# Patient Record
Sex: Male | Born: 1965 | Race: White | Hispanic: No | Marital: Married | Smoking: Former smoker
Health system: Southern US, Community
[De-identification: ages and names within clinical notes are randomized; demographics above are authoritative.]

## PROBLEM LIST (undated history)

## (undated) DIAGNOSIS — J302 Other seasonal allergic rhinitis: Secondary | ICD-10-CM

## (undated) DIAGNOSIS — E139 Other specified diabetes mellitus without complications: Secondary | ICD-10-CM

## (undated) DIAGNOSIS — K760 Fatty (change of) liver, not elsewhere classified: Secondary | ICD-10-CM

## (undated) DIAGNOSIS — I10 Essential (primary) hypertension: Secondary | ICD-10-CM

## (undated) DIAGNOSIS — E119 Type 2 diabetes mellitus without complications: Secondary | ICD-10-CM

## (undated) DIAGNOSIS — J309 Allergic rhinitis, unspecified: Secondary | ICD-10-CM

## (undated) DIAGNOSIS — M722 Plantar fascial fibromatosis: Secondary | ICD-10-CM

## (undated) DIAGNOSIS — G4730 Sleep apnea, unspecified: Secondary | ICD-10-CM

## (undated) DIAGNOSIS — K219 Gastro-esophageal reflux disease without esophagitis: Secondary | ICD-10-CM

## (undated) DIAGNOSIS — K769 Liver disease, unspecified: Secondary | ICD-10-CM

## (undated) HISTORY — DX: Type 2 diabetes mellitus without complications: E11.9

## (undated) HISTORY — PX: ELBOW SURGERY: SHX618

## (undated) HISTORY — PX: BACK SURGERY: SHX140

## (undated) HISTORY — DX: Essential (primary) hypertension: I10

## (undated) HISTORY — PX: SPINE SURGERY: SHX786

---

## 2014-11-06 ENCOUNTER — Ambulatory Visit
Admission: EM | Admit: 2014-11-06 | Discharge: 2014-11-06 | Disposition: A | Discharge status: Home / Self Care | Payer: BLUE CROSS/BLUE SHIELD | Attending: Internal Medicine | Admitting: Internal Medicine

## 2014-11-06 DIAGNOSIS — Z021 Encounter for pre-employment examination: Secondary | ICD-10-CM

## 2014-11-06 LAB — DEPT OF TRANSP DIPSTICK, URINE (ARMC ONLY)
Glucose, UA: NEGATIVE mg/dL
PROTEIN: 100 mg/dL — AB
Specific Gravity, Urine: 1.02 (ref 1.005–1.030)

## 2014-11-06 NOTE — ED Provider Notes (Signed)
CSN: [id number]     Arrival date & time 11/06/14  1901 History   None    Chief Complaint  Patient presents with  . Employment Physical   (Consider location/radiation/quality/duration/timing/severity/associated sxs/prior Treatment) HPI is a 49 year old male who presents for a DOT physical he states that time his run out on his medical card and [facility] [place] [facility] that his license was suspended. The patient states that that he is using a CPAP machine but has not been evaluated by pulmonologist or the machine adjusted in over 7 years. He denies most of his supplies from the Internet. His primary care had initially diagnosed him with the truck to sleep apnea and referred him to a pulmonologist but has never followed up. He states that he uses CPAP on a nightly basis no documentation is accompanying him today.  History reviewed. No pertinent past medical history. History reviewed. No pertinent past surgical history. History reviewed. No pertinent family history. History  Substance Use Topics  . Smoking status: Never Smoker   . Smokeless tobacco: Not on file  . Alcohol Use: Yes     Comment: social    Review of Systems  All other systems reviewed and are negative.   Allergies  Review of patient's allergies indicates no known allergies.  Home Medications   Prior to Admission medications   Medication Sig Start Date End Date Taking? Authorizing Provider  omeprazole (PRILOSEC) 10 MG capsule Take 10 mg by mouth daily.   Yes Historical Provider, MD   BP 124/89 mmHg  Pulse 74  Temp(Src) 97.6 F (36.4 C) (Tympanic)  Resp 16  Ht 5\' 9"  (1.753 m)  Wt 211 lb (95.709 kg)  BMI 31.15 kg/m2  SpO2 100% Physical Exam  Constitutional:  See DOT physical exam form    ED Course  Procedures (including critical care time) Labs Review Labs Reviewed  DEPT OF TRANSP DIPSTICK, URINE([facility] ONLY) - Abnormal; Notable for the following:    Protein, ur 100 (*)    Hgb urine dipstick TRACE  (*)    All other components within normal limits    Imaging Review No results found.   MDM   1. Physical exam, pre-employment    This patient is here for a DOT physical. Is a history of struck to sleep apnea and uses a CPAP machine on a nightly basis. He states that he has not had any scratches to the machine nor any follow-up with a pulmonologist for over 7 years. He has no documentation regarding use of his CPAP machine. I will give him 3 months to and to download his machine usage and also be evaluated by pulmonologist and cleared or driving. He also has some proteinuria that I asked him to follow-up with his primary care. When he returns with the approval from his pulmonologist in the usage of the machine if it's adequate I will then issue him a one-year certificate from today's date. He has the future that he must have this done on a yearly basis along with a documentation each time he comes for his DOT physical    Lutricia FeilWilliam P Patricie Geeslin, PA-C 11/06/14 2120

## 2014-11-06 NOTE — ED Notes (Signed)
I am here for DOT physical.  

## 2015-09-17 ENCOUNTER — Encounter: Payer: Self-pay | Admitting: Gynecology

## 2015-09-17 ENCOUNTER — Ambulatory Visit
Admission: EM | Admit: 2015-09-17 | Discharge: 2015-09-17 | Disposition: A | Discharge status: Home / Self Care | Payer: BLUE CROSS/BLUE SHIELD | Attending: Family Medicine | Admitting: Family Medicine

## 2015-09-17 DIAGNOSIS — J069 Acute upper respiratory infection, unspecified: Secondary | ICD-10-CM

## 2015-09-17 HISTORY — DX: Allergic rhinitis, unspecified: J30.9

## 2015-09-17 HISTORY — DX: Gastro-esophageal reflux disease without esophagitis: K21.9

## 2015-09-17 HISTORY — DX: Sleep apnea, unspecified: G47.30

## 2015-09-17 LAB — RAPID STREP SCREEN (MED CTR MEBANE ONLY): Streptococcus, Group A Screen (Direct): NEGATIVE

## 2015-09-17 MED ORDER — AZITHROMYCIN 250 MG PO TABS: ORAL_TABLET | ORAL | Status: DC

## 2015-09-17 NOTE — ED Notes (Signed)
Patient c/o cough and chest congestion x 4 days. Per patient slight sore throat/ head ace.

## 2015-09-17 NOTE — ED Provider Notes (Signed)
CSN: [id number]     Arrival date & time 09/17/15  1527 History   First MD Initiated Contact with Patient 09/17/15 1623     Chief Complaint  Patient presents with  . Cough   (Consider location/radiation/quality/duration/timing/severity/associated sxs/prior Treatment) HPI: Patient presents today with symptoms of cough and congestion. Patient states that he's had the symptoms for the last 4 days. He does have a mild sore throat and headache. He denies any high fevers or body aches. He states that several people at work have sinus infections. He has been taking Zyrtec and Mucinex. He denies any history of smoking or asthma. He denies any chest pain or shortness of breath.  Past Medical History  Diagnosis Date  . GERD (gastroesophageal reflux disease)   . Sleep apnea   . Allergic rhinitis    Past Surgical History  Procedure Laterality Date  . Spine surgery    . Elbow surgery Right    No family history on file. Social History  Substance Use Topics  . Smoking status: Never Smoker   . Smokeless tobacco: None  . Alcohol Use: Yes     Comment: social    Review of Systems: Negative except mentioned above.   Allergies  Review of patient's allergies indicates no known allergies.  Home Medications   Prior to Admission medications   Medication Sig Start Date End Date Taking? Authorizing Provider  cetirizine (ZYRTEC) 10 MG tablet Take 10 mg by mouth daily.   Yes Historical Provider, MD  omeprazole (PRILOSEC) 10 MG capsule Take 10 mg by mouth daily.    Historical Provider, MD   Meds Ordered and Administered this Visit  Medications - No data to display  BP 121/86 mmHg  Pulse 70  Temp(Src) 98.3 F (36.8 C) (Oral)  Resp 18  Ht 5\' 8"  (1.727 m)  Wt 210 lb (95.255 kg)  BMI 31.94 kg/m2  SpO2 98% No data found.   Physical Exam   GENERAL: NAD HEENT: moderate pharyngeal erythema, no exudate, no erythema of TMs, no cervical LAD RESP: CTA B CARD: RRR NEURO: CN II-XII grossly intact    ED Course  Procedures (including critical care time)  Labs Review Labs Reviewed  RAPID STREP SCREEN (NOT AT [facility])    Imaging Review No results found.    MDM  A/P: URI- Rapid strep test was negative. Encourage patient continue taking his Zyrtec, can try Mucinex or Delsym, Tylenol/Motrin when necessary, rest, hydration, if symptoms persist or worsen over the next 1-2 days he can start the Z-Pak. If any further problems he is to seek medical attention as discussed.    Jolene ProvostKirtida Moet Mikulski, MD 09/17/15 [id number]

## 2015-09-19 LAB — CULTURE, GROUP A STREP (THRC)

## 2016-02-16 DIAGNOSIS — E119 Type 2 diabetes mellitus without complications: Secondary | ICD-10-CM | POA: Insufficient documentation

## 2016-02-18 ENCOUNTER — Other Ambulatory Visit: Payer: Self-pay | Admitting: Family Medicine

## 2016-02-18 DIAGNOSIS — R1011 Right upper quadrant pain: Secondary | ICD-10-CM

## 2016-02-18 DIAGNOSIS — R748 Abnormal levels of other serum enzymes: Secondary | ICD-10-CM

## 2016-02-27 ENCOUNTER — Ambulatory Visit: Payer: BLUE CROSS/BLUE SHIELD

## 2016-03-09 ENCOUNTER — Ambulatory Visit: Payer: BLUE CROSS/BLUE SHIELD | Admitting: Dietician

## 2016-06-11 ENCOUNTER — Ambulatory Visit
Admission: RE | Admit: 2016-06-11 | Discharge: 2016-06-11 | Disposition: A | Discharge status: Home / Self Care | Payer: 59 | Source: Ambulatory Visit | Attending: Gastroenterology | Admitting: Gastroenterology

## 2016-06-11 ENCOUNTER — Ambulatory Visit: Payer: 59 | Admitting: Certified Registered"

## 2016-06-11 ENCOUNTER — Encounter
Admission: RE | Disposition: A | Discharge status: Home / Self Care | Payer: Self-pay | Source: Ambulatory Visit | Attending: Gastroenterology

## 2016-06-11 DIAGNOSIS — K635 Polyp of colon: Secondary | ICD-10-CM | POA: Diagnosis not present

## 2016-06-11 DIAGNOSIS — J449 Chronic obstructive pulmonary disease, unspecified: Secondary | ICD-10-CM | POA: Insufficient documentation

## 2016-06-11 DIAGNOSIS — Z79899 Other long term (current) drug therapy: Secondary | ICD-10-CM | POA: Diagnosis not present

## 2016-06-11 DIAGNOSIS — G4730 Sleep apnea, unspecified: Secondary | ICD-10-CM | POA: Insufficient documentation

## 2016-06-11 DIAGNOSIS — Z87891 Personal history of nicotine dependence: Secondary | ICD-10-CM | POA: Diagnosis not present

## 2016-06-11 DIAGNOSIS — K644 Residual hemorrhoidal skin tags: Secondary | ICD-10-CM | POA: Diagnosis not present

## 2016-06-11 DIAGNOSIS — K219 Gastro-esophageal reflux disease without esophagitis: Secondary | ICD-10-CM | POA: Insufficient documentation

## 2016-06-11 DIAGNOSIS — Z1211 Encounter for screening for malignant neoplasm of colon: Secondary | ICD-10-CM | POA: Diagnosis not present

## 2016-06-11 HISTORY — DX: Fatty (change of) liver, not elsewhere classified: K76.0

## 2016-06-11 HISTORY — PX: COLONOSCOPY WITH PROPOFOL: SHX5780

## 2016-06-11 LAB — GLUCOSE, CAPILLARY: Glucose-Capillary: 127 mg/dL — ABNORMAL HIGH (ref 65–99)

## 2016-06-11 SURGERY — COLONOSCOPY WITH PROPOFOL
Anesthesia: General

## 2016-06-11 MED ORDER — PROPOFOL 500 MG/50ML IV EMUL
INTRAVENOUS | Status: DC | PRN
  Administered 2016-06-11: 150 ug/kg/min via INTRAVENOUS

## 2016-06-11 MED ORDER — PROPOFOL 500 MG/50ML IV EMUL
INTRAVENOUS | Status: AC
  Filled 2016-06-11: qty 50

## 2016-06-11 MED ORDER — PROPOFOL 10 MG/ML IV BOLUS
INTRAVENOUS | Status: DC | PRN
Start: 2016-06-11 — End: 2016-06-11
  Administered 2016-06-11: 60 mg via INTRAVENOUS

## 2016-06-11 MED ORDER — LIDOCAINE HCL (CARDIAC) 20 MG/ML IV SOLN
INTRAVENOUS | Status: DC | PRN
  Administered 2016-06-11: 100 mg via INTRAVENOUS

## 2016-06-11 MED ORDER — SODIUM CHLORIDE 0.9 % IV SOLN
INTRAVENOUS | Status: DC
  Administered 2016-06-11: 1000 mL via INTRAVENOUS

## 2016-06-11 MED ORDER — LIDOCAINE HCL (PF) 2 % IJ SOLN
INTRAMUSCULAR | Status: AC
  Filled 2016-06-11: qty 2

## 2016-06-11 MED ORDER — SODIUM CHLORIDE 0.9 % IV SOLN: INTRAVENOUS | Status: DC

## 2016-06-11 NOTE — Anesthesia Postprocedure Evaluation (Signed)
Anesthesia Post Note  Patient: Pedro Gardner  Procedure(s) Performed: Procedure(s) (LRB): COLONOSCOPY WITH PROPOFOL (N/A)  Patient location during evaluation: Endoscopy Anesthesia Type: General Level of consciousness: awake and alert and oriented Pain management: pain level controlled Vital Signs Assessment: post-procedure vital signs reviewed and stable Respiratory status: spontaneous breathing, nonlabored ventilation and respiratory function stable Cardiovascular status: blood pressure returned to baseline and stable Postop Assessment: no signs of nausea or vomiting Anesthetic complications: no     Last Vitals:  Vitals:   06/11/16 0952 06/11/16 1012  BP: (!) 130/93 135/85  Pulse: (!) 59 (!) 53  Resp: 17 17  Temp:      Last Pain:  Vitals:   06/11/16 0932  TempSrc: Tympanic                 [name]

## 2016-06-11 NOTE — Anesthesia Post-op Follow-up Note (Cosign Needed)
Anesthesia QCDR form completed.        

## 2016-06-11 NOTE — Op Note (Signed)
[facility]enter[facility] Patient Name: Pedro Gardner Procedure Date: 06/11/2016 9:02 AM MRN: [id number] Account #: [id number] Date of Birth: 08-16-1965 Admit Type: Outpatient Age: [age] [facility] 3 Gender: Male Note Status: Finalized Procedure:            Colonoscopy Indications:          Screening for colorectal malignant neoplasm, This is                        the patient's first colonoscopy Providers:            Christena DeemMartin U. Skulskie, MD Referring MD:         Marina Goodellale E. Feldpausch (Referring MD) Medicines:            Monitored Anesthesia Care Complications:        No immediate complications. Procedure:            Pre-Anesthesia Assessment:                       - ASA Grade Assessment: III - A patient with severe                        systemic disease.                       After obtaining informed consent, the colonoscope was                        passed under direct vision. Throughout the procedure,                        the patient's blood pressure, pulse, and oxygen                        saturations were monitored continuously. The                        Colonoscope was introduced through the anus and                        advanced to the the cecum, identified by appendiceal                        orifice and ileocecal valve. The colonoscopy was                        performed without difficulty. The patient tolerated the                        procedure well. The quality of the bowel preparation                        was good. Findings:      A 3 mm polyp was found in the distal sigmoid colon. The polyp was       sessile. The polyp was removed with a cold snare. Resection and       retrieval were complete.      Non-bleeding external hemorrhoids were found during perianal exam. The       hemorrhoids were small.      The retroflexed view of the distal rectum and anal  verge was normal and       showed no anal or rectal  abnormalities. Impression:           - One 3 mm polyp in the distal sigmoid colon, removed                        with a cold snare. Resected and retrieved.                       - Non-bleeding external hemorrhoids.                       - The distal rectum and anal verge are normal on                        retroflexion view. Recommendation:       - Await pathology results.                       - Telephone GI clinic for pathology results in 1 week. Procedure Code(s):    --- Professional ---                       [phone], Colonoscopy, flexible; with removal of tumor(s),                        polyp(s), or other lesion(s) by snare technique Diagnosis Code(s):    --- Professional ---                       Z12.11, Encounter for screening for malignant neoplasm                        of colon                       D12.5, Benign neoplasm of sigmoid colon                       K64.4, Residual hemorrhoidal skin tags CPT copyright 2016 [employer]. All rights reserved. The codes documented in this report are preliminary and upon coder review may  be revised to meet current compliance requirements. Christena Deem, MD 06/11/2016 9:29:58 AM This report has been signed electronically. Number of Addenda: 0 Note Initiated On: 06/11/2016 9:02 AM Scope Withdrawal Time: 0 hours 6 minutes 23 seconds  Total Procedure Duration: 0 hours 17 minutes 12 seconds       [facility]

## 2016-06-11 NOTE — H&P (Signed)
Outpatient short stay form Pre-procedure 06/11/2016 8:47 AM Pedro DeemMartin U Yui Mulvaney MD  Primary Physician: Dr. Maudie Flakesale Feldpausch  Reason for visit:  Colonoscopy  History of present illness:  Patient is a 51 year old male presenting today for a screening colonoscopy. This is his first colonoscopy. He takes no aspirin or blood thinning agents. He tolerated his prep well.    Current Facility-Administered Medications:  .  0.9 %  sodium chloride infusion, , Intravenous, Continuous, Pedro DeemMartin U Julena Barbour, MD .  0.9 %  sodium chloride infusion, , Intravenous, Continuous, Pedro DeemMartin U Maelle Sheaffer, MD  Prescriptions Prior to Admission  Medication Sig Dispense Refill Last Dose  . loratadine (CLARITIN) 10 MG tablet Take 10 mg by mouth daily.   06/10/2016 at Unknown time  . omeprazole (PRILOSEC) 10 MG capsule Take 10 mg by mouth daily.   06/10/2016 at Unknown time  . azithromycin (ZITHROMAX Z-PAK) 250 MG tablet Use as directed for 5 days as on box. (Patient not taking: Reported on 06/10/2016) 6 each 0 Completed Course at Unknown time  . cetirizine (ZYRTEC) 10 MG tablet Take 10 mg by mouth daily.   Not Taking at Unknown time     No Known Allergies   Past Medical History:  Diagnosis Date  . Allergic rhinitis   . Fatty liver   . GERD (gastroesophageal reflux disease)   . Sleep apnea     Review of systems:      Physical Exam    Heart and lungs: Regular rate and rhythm without rub or gallop, lungs are bilaterally clear.    HEENT: Normocephalic atraumatic eyes are anicteric    Other:     Pertinant exam for procedure: Soft nontender nondistended bowel sounds positive normoactive    Planned proceedures: Colonoscopy and indicated procedures. I have discussed the risks benefits and complications of procedures to include not limited to bleeding, infection, perforation and the risk of sedation and the patient wishes to proceed.    Pedro DeemMartin U Aidan Caloca, MD Gastroenterology 06/11/2016  8:47 AM

## 2016-06-11 NOTE — Anesthesia Preprocedure Evaluation (Signed)
Anesthesia Evaluation  Patient identified by MRN, date of birth, ID band Patient awake    Reviewed: Allergy & Precautions, NPO status , Patient's Chart, lab work & pertinent test results  History of Anesthesia Complications Negative for: history of anesthetic complications  Airway Mallampati: II  TM Distance: >3 FB Neck ROM: Full    Dental no notable dental hx.    Pulmonary sleep apnea and Continuous Positive Airway Pressure Ventilation , neg COPD, former smoker,    breath sounds clear to auscultation- rhonchi (-) wheezing      Cardiovascular Exercise Tolerance: Good (-) hypertension(-) CAD and (-) Past MI  Rhythm:Regular Rate:Normal - Systolic murmurs and - Diastolic murmurs    Neuro/Psych negative neurological ROS  negative psych ROS   GI/Hepatic Neg liver ROS, GERD  ,  Endo/Other  negative endocrine ROSdiabetes (diet controlled)  Renal/GU negative Renal ROS     Musculoskeletal negative musculoskeletal ROS (+)   Abdominal (+) + obese,   Peds  Hematology negative hematology ROS (+)   Anesthesia Other Findings Past Medical History: No date: Allergic rhinitis No date: Fatty liver No date: GERD (gastroesophageal reflux disease) No date: Sleep apnea   Reproductive/Obstetrics                             Anesthesia Physical Anesthesia Plan  ASA: III  Anesthesia Plan: General   Post-op Pain Management:    Induction: Intravenous  Airway Management Planned: Natural Airway  Additional Equipment:   Intra-op Plan:   Post-operative Plan:   Informed Consent: I have reviewed the patients History and Physical, chart, labs and discussed the procedure including the risks, benefits and alternatives for the proposed anesthesia with the patient or authorized representative who has indicated his/her understanding and acceptance.   Dental advisory given  Plan Discussed with: CRNA and  Anesthesiologist  Anesthesia Plan Comments:         Anesthesia Quick Evaluation

## 2016-06-11 NOTE — Transfer of Care (Signed)
Immediate Anesthesia Transfer of Care Note  Patient: Pedro Gardner  Procedure(s) Performed: Procedure(s): COLONOSCOPY WITH PROPOFOL (N/A)  Patient Location: Endoscopy Unit  Anesthesia Type:General  Level of Consciousness: awake, oriented and patient cooperative  Airway & Oxygen Therapy: Patient Spontanous Breathing and Patient connected to nasal cannula oxygen  Post-op Assessment: Report given to RN, Post -op Vital signs reviewed and stable and Patient moving all extremities X 4  Post vital signs: Reviewed and stable  Last Vitals:  Vitals:   06/11/16 0843  BP: 130/88  Pulse: (!) 58  Resp: 16  Temp: (!) 35.9 C    Last Pain:  Vitals:   06/11/16 0843  TempSrc: Tympanic         Complications: No apparent anesthesia complications

## 2016-06-14 ENCOUNTER — Encounter: Payer: Self-pay | Admitting: Gastroenterology

## 2016-06-14 LAB — SURGICAL PATHOLOGY

## 2017-05-10 DIAGNOSIS — K769 Liver disease, unspecified: Secondary | ICD-10-CM

## 2017-05-10 HISTORY — DX: Liver disease, unspecified: K76.9

## 2017-10-07 ENCOUNTER — Encounter: Payer: Self-pay | Admitting: Urology

## 2017-10-07 ENCOUNTER — Ambulatory Visit
Admission: RE | Admit: 2017-10-07 | Discharge: 2017-10-07 | Disposition: A | Discharge status: Home / Self Care | Payer: Managed Care, Other (non HMO) | Source: Ambulatory Visit | Attending: Urology | Admitting: Urology

## 2017-10-07 ENCOUNTER — Ambulatory Visit: Payer: Managed Care, Other (non HMO) | Admitting: Urology

## 2017-10-07 VITALS — BP 142/94 | HR 71 | Ht 68.0 in | Wt 210.6 lb

## 2017-10-07 DIAGNOSIS — N281 Cyst of kidney, acquired: Secondary | ICD-10-CM | POA: Diagnosis not present

## 2017-10-07 DIAGNOSIS — N200 Calculus of kidney: Secondary | ICD-10-CM | POA: Insufficient documentation

## 2017-10-07 DIAGNOSIS — K7689 Other specified diseases of liver: Secondary | ICD-10-CM

## 2017-10-07 DIAGNOSIS — R3129 Other microscopic hematuria: Secondary | ICD-10-CM | POA: Diagnosis not present

## 2017-10-07 DIAGNOSIS — N201 Calculus of ureter: Secondary | ICD-10-CM

## 2017-10-07 LAB — URINALYSIS, COMPLETE
BILIRUBIN UA: NEGATIVE
Glucose, UA: NEGATIVE
Ketones, UA: NEGATIVE
LEUKOCYTES UA: NEGATIVE
Nitrite, UA: NEGATIVE
Specific Gravity, UA: 1.02 (ref 1.005–1.030)
Urobilinogen, Ur: 0.2 mg/dL (ref 0.2–1.0)
pH, UA: 5.5 (ref 5.0–7.5)

## 2017-10-07 LAB — MICROSCOPIC EXAMINATION: Epithelial Cells (non renal): NONE SEEN /hpf (ref 0–10)

## 2017-10-07 NOTE — Progress Notes (Addendum)
 10/07/2017 8:43 AM   Pedro Gardner 01/22/1966 [id number]  Referring provider: Feldpausch, Dale E, MD [address] [place], [place] [address]  Chief Complaint  Patient presents with  . Nephrolithiasis    HPI: 52-year-old male who presents today for follow-up after ER visit for left proximal ureteral calculus.    Pedro Gardner was seen and evaluated in the emergency room and given 10/2017 with acute onset left flank pain.  UA showed large amount of blood but otherwise no evidence of infection.  Noncontrast CT scan showed a 4 mm left proximal ureteral calculus which appeared to be partially obstructing per the report.  Based on the dictation, there is no additional nonobstructing stones.   He does have bilateral presumed renal cysts as well as a  lesion in his liver.  He reports that he has not had any pain in 3 weeks.  He has not seen a stone pass but has not been straining his urine.  No gross hematuria or dysuria.  No previous history of kidney stones.  He drinks primarily water.  On occasion, he does note that his urine is dark in color and more concentrated.  PMH: Past Medical History:  Diagnosis Date  . Allergic rhinitis   . Diabetes mellitus without complication (HCC)   . Fatty liver   . GERD (gastroesophageal reflux disease)   . Hypertension   . Sleep apnea     Surgical History: Past Surgical History:  Procedure Laterality Date  . BACK SURGERY    . COLONOSCOPY WITH PROPOFOL N/A 06/11/2016   Procedure: COLONOSCOPY WITH PROPOFOL;  Surgeon: Martin U Skulskie, MD;  Location: [facility] ENDOSCOPY;  Service: Endoscopy;  Laterality: N/A;  . ELBOW SURGERY Right   . SPINE SURGERY      Home Medications:  Allergies as of 10/07/2017   No Known Allergies     Medication List        Accurate as of 10/07/17  8:43 AM. Always use your most recent med list.          cyanocobalamin 100 MCG tablet Take by mouth.   ibuprofen 600 MG tablet Commonly known as:  ADVIL,MOTRIN Take  600 mg by mouth every 6 (six) hours as needed. for pain   loratadine 10 MG tablet Commonly known as:  CLARITIN Take 10 mg by mouth daily.   Magnesium 250 MG Tabs Take by mouth.   metFORMIN 500 MG tablet Commonly known as:  GLUCOPHAGE Take 500 mg by mouth 2 (two) times daily with a meal.   omeprazole 10 MG capsule Commonly known as:  PRILOSEC Take 10 mg by mouth daily.   tamsulosin 0.4 MG Caps capsule Commonly known as:  FLOMAX Take 0.4 mg by mouth daily.       Allergies: No Known Allergies  Family History: Family History  Adopted: Yes    Social History:  reports that he has never smoked. He has never used smokeless tobacco. He reports that he drinks alcohol. He reports that he does not use drugs.  ROS: UROLOGY Frequent Urination?: No Hard to postpone urination?: No Burning/pain with urination?: No Get up at night to urinate?: No Leakage of urine?: No Urine stream starts and stops?: No Trouble starting stream?: No Do you have to strain to urinate?: No Blood in urine?: No Urinary tract infection?: No Sexually transmitted disease?: No Injury to kidneys or bladder?: No Painful intercourse?: No Weak stream?: No Erection problems?: No Penile pain?: No  Gastrointestinal Nausea?: No Vomiting?: No Indigestion/heartburn?:   Yes Diarrhea?: No Constipation?: No  Constitutional Fever: No Night sweats?: No Weight loss?: No Fatigue?: Yes  Skin Skin rash/lesions?: No Itching?: No  Eyes Blurred vision?: No Double vision?: No  Ears/Nose/Throat Sore throat?: No Sinus problems?: No  Hematologic/Lymphatic Swollen glands?: No Easy bruising?: No  Cardiovascular Leg swelling?: No Chest pain?: No  Respiratory Cough?: No Shortness of breath?: No  Endocrine Excessive thirst?: No  Musculoskeletal Back pain?: No Joint pain?: No  Neurological Headaches?: No Dizziness?: No  Psychologic Depression?: No Anxiety?: No  Physical Exam: BP (!) 142/94  (BP Location: Right Arm, Patient Position: Sitting, Cuff Size: Large)   Pulse 71   Ht 5' 8" (1.727 m)   Wt 210 lb 9.6 oz (95.5 kg)   SpO2 99%   BMI 32.02 kg/m   Constitutional:  Alert and oriented, No acute distress.  Accompanied by wife today.  He reports he drinks primarily water. HEENT: Pedro Gardner AT, moist mucus membranes.  Trachea midline, no masses. Cardiovascular: No clubbing, cyanosis, or edema. Respiratory: Normal respiratory effort, no increased work of breathing. GI: Abdomen is soft, nontender, nondistended, no abdominal masses GU: No CVA tenderness Lymph: No cervical or inguinal lymphadenopathy. Skin: No rashes, bruises or suspicious lesions. Neurologic: Grossly intact, no focal deficits, moving all 4 extremities. Psychiatric: Normal mood and affect.  Laboratory Data: Creatinine 1.07/2017  Urinalysis Results for orders placed or performed in visit on 10/07/17  Microscopic Examination  Result Value Ref Range   WBC, UA 6-10 (A) 0 - 5 /hpf   RBC, UA 3-10 (A) 0 - 2 /hpf   Epithelial Cells (non renal) None seen 0 - 10 /hpf   Casts Present (A) None seen /lpf   Cast Type Hyaline casts N/A   Mucus, UA Present (A) Not Estab.   Bacteria, UA Few (A) None seen/Few  Urinalysis, Complete  Result Value Ref Range   Specific Gravity, UA 1.020 1.005 - 1.030   pH, UA 5.5 5.0 - 7.5   Color, UA Yellow Yellow   Appearance Ur Clear Clear   Leukocytes, UA Negative Negative   Protein, UA 3+ (A) Negative/Trace   Glucose, UA Negative Negative   Ketones, UA Negative Negative   RBC, UA 2+ (A) Negative   Bilirubin, UA Negative Negative   Urobilinogen, Ur 0.2 0.2 - 1.0 mg/dL   Nitrite, UA Negative Negative   Microscopic Examination See below:     Pertinent Imaging: Result Impression  -- 4 mm partially obstructing left proximal ureteral stone with associated proximal mild hydroureteronephrosis and perinephric fat stranding. -- 1.9 cm hypoattenuating lesion within the right hepatic lobe which  is incompletely evaluated. Recommend follow-up with contrasted CT, MRI, or ultrasound. -- Multiple hypoattenuating lesions within the right kidney, measuring up to 2.0 cm, which are incompletely evaluated. Statistically, these likely represent renal cysts. -- Hepatic steatosis.  The findings of this study were discussed via telephone with Dr. DANIEL JAMES MIGLIACCIO by Dr. Sean Wagner at 09/12/2017 8:50 AM.   Result Narrative  EXAM: CT abdomen and pelvis without contrast DATE: 09/12/2017 8:24 AM ACCESSION: [id number] DICTATED: 09/12/2017 8:31 AM INTERPRETATION LOCATION: [facility]  CLINICAL INDICATION: 52 years old Male with ABDOMINAL PAIN, (specify site in comments)- left sided, ? mild hydro on POCUS-  COMPARISON: None  TECHNIQUE: A spiral CT scan was obtained without IV contrast from the top of the kidneys through the entire bladder.Images were reconstructed in the axial plane. Coronal and sagittal reformatted images were also provided for further evaluation.  FINDINGS:Evaluation of the solid   organs and vasculature is limited in the absence of intravenous contrast.  LOWER CHEST: No pleural effusion. No pericardial effusion. Minimal subsegmental atelectasis. No pulmonary nodules.  ABDOMEN/PELVIS:  HEPATOBILIARY: Segmental hepatic steatosis. There is a 1.9 cm hypoattenuating lesion within the right hepatic lobe (2:32). No biliary ductal dilatation. Gallbladder demonstrates hyperattenuating material in the dependent portions likely representing small stones and/or sludge. PANCREAS: Unremarkable. SPLEEN: Unremarkable. ADRENAL GLANDS: Unremarkable. KIDNEYS/URETERS: There is a 4 mm stone within the proximal left ureter (2:64) with associated mild hydroureteronephrosis and perinephric fat stranding. There are multiple hypoattenuating lesions within the right kidney, the largest measuring approximately 2.0 cm in the upper pole (2:51) upper pole of the right kidney.  BLADDER: Partially  distended, otherwise unremarkable. BOWEL/PERITONEUM/RETROPERITONEUM: No bowel obstruction. No acute inflammatory process. No ascites. Colonic diverticulosis without evidence of diverticulitis. The appendix is unremarkable. VASCULATURE: Abdominal aorta within normal limits for patient's age. Unremarkable inferior vena cava. LYMPH NODES: No adenopathy. REPRODUCTIVE ORGANS: Unremarkable.  BONES/SOFT TISSUES: Multilevel degenerative disc disease with vacuum disc phenomenon. There is mild retrolisthesis of L5 on S1. No suspicious osseous lesions.    Unable to review CT scan personally.  Report reviewed.  Assessment & Plan:    1. Left ureteral stone Small 4 mm left proximal ureteral stone with no further pain x3 weeks Statistically, he is likely already passed the stone KUB today to confirm this-we will call him with results If he develops any further flank pain, advised to return We discussed general stone prevention techniques including drinking plenty water with goal of producing 2.5 L urine daily, increased citric acid intake, avoidance of high oxalate containing foods, and decreased salt intake.  Information about dietary recommendations given today. - Urinalysis, Complete - DG Abd 1 View; Future - CULTURE, URINE COMPREHENSIVE  2. Microscopic hematuria Likely secondary to #1 We will arrange for patient to drop off another urine in 1 month to ensure that this resolves  3. Renal cyst Per report, likely renal cyst which should require no further treatment or evaluation He does have further imaging for a #4, we can likely confirm that these lesions on the report are indeed cysts suspected He is agreeable this plan  4. Liver cyst Incidental hypoattenuating liver lesion per CT scan report Discussed that likely this represents a cyst but he should have follow-up for this, advised him to follow-up with his PCP per radiology recommendations to order appropriate study   Mell Mellott,  MD  [facility] [address], [address] [place], [place] [address] [phone]   Addendum: KUB ordered as above.  Based on the KUB and urinalysis, it appears that he has a left proximal retained ureteral stone.  I called him later in the afternoon to discuss options moving forward.  Given these failed to pass this relatively small stone at least 3 weeks, and a high suspicion that he will require surgical intervention.  We discussed his options including shockwave lithotripsy and ureteroscopy.  Risks and benefits of each were discussed in detail as well as the efficacy rate of each.  He is most interested in shockwave lithotripsy as a primary intervention.  He understands risk of bleeding, infection, damage to surrounding structures, hematoma, and procedure failure amongst others.  All questions were answered.  He will present to the office on Monday to fill out paperwork for this procedure.  He will hold all NSAIDs in the meantime.  Dhruti Ghuman, MD  

## 2017-10-07 NOTE — H&P (View-Only) (Signed)
10/07/2017 8:43 AM   Pedro Gardner 11-May-1965 [id number]  Referring provider: Marina Goodell, MD [address] [place], [place] [address]  Chief Complaint  Patient presents with  . Nephrolithiasis    HPI: 52 year old male who presents today for follow-up after ER visit for left proximal ureteral calculus.    Pedro Gardner was seen and evaluated in the emergency room and given 10/2017 with acute onset left flank pain.  UA showed large amount of blood but otherwise no evidence of infection.  Noncontrast CT scan showed a 4 mm left proximal ureteral calculus which appeared to be partially obstructing per the report.  Based on the dictation, there is no additional nonobstructing stones.   He does have bilateral presumed renal cysts as well as a  lesion in his liver.  He reports that he has not had any pain in 3 weeks.  He has not seen a stone pass but has not been straining his urine.  No gross hematuria or dysuria.  No previous history of kidney stones.  He drinks primarily water.  On occasion, he does note that his urine is dark in color and more concentrated.  PMH: Past Medical History:  Diagnosis Date  . Allergic rhinitis   . Diabetes mellitus without complication (HCC)   . Fatty liver   . GERD (gastroesophageal reflux disease)   . Hypertension   . Sleep apnea     Surgical History: Past Surgical History:  Procedure Laterality Date  . BACK SURGERY    . COLONOSCOPY WITH PROPOFOL N/A 06/11/2016   Procedure: COLONOSCOPY WITH PROPOFOL;  Surgeon: Christena Deem, MD;  Location: [facility] ENDOSCOPY;  Service: Endoscopy;  Laterality: N/A;  . ELBOW SURGERY Right   . SPINE SURGERY      Home Medications:  Allergies as of 10/07/2017   No Known Allergies     Medication List        Accurate as of 10/07/17  8:43 AM. Always use your most recent med list.          cyanocobalamin 100 MCG tablet Take by mouth.   ibuprofen 600 MG tablet Commonly known as:  ADVIL,MOTRIN Take  600 mg by mouth every 6 (six) hours as needed. for pain   loratadine 10 MG tablet Commonly known as:  CLARITIN Take 10 mg by mouth daily.   Magnesium 250 MG Tabs Take by mouth.   metFORMIN 500 MG tablet Commonly known as:  GLUCOPHAGE Take 500 mg by mouth 2 (two) times daily with a meal.   omeprazole 10 MG capsule Commonly known as:  PRILOSEC Take 10 mg by mouth daily.   tamsulosin 0.4 MG Caps capsule Commonly known as:  FLOMAX Take 0.4 mg by mouth daily.       Allergies: No Known Allergies  Family History: Family History  Adopted: Yes    Social History:  reports that he has never smoked. He has never used smokeless tobacco. He reports that he drinks alcohol. He reports that he does not use drugs.  ROS: UROLOGY Frequent Urination?: No Hard to postpone urination?: No Burning/pain with urination?: No Get up at night to urinate?: No Leakage of urine?: No Urine stream starts and stops?: No Trouble starting stream?: No Do you have to strain to urinate?: No Blood in urine?: No Urinary tract infection?: No Sexually transmitted disease?: No Injury to kidneys or bladder?: No Painful intercourse?: No Weak stream?: No Erection problems?: No Penile pain?: No  Gastrointestinal Nausea?: No Vomiting?: No Indigestion/heartburn?:  Yes Diarrhea?: No Constipation?: No  Constitutional Fever: No Night sweats?: No Weight loss?: No Fatigue?: Yes  Skin Skin rash/lesions?: No Itching?: No  Eyes Blurred vision?: No Double vision?: No  Ears/Nose/Throat Sore throat?: No Sinus problems?: No  Hematologic/Lymphatic Swollen glands?: No Easy bruising?: No  Cardiovascular Leg swelling?: No Chest pain?: No  Respiratory Cough?: No Shortness of breath?: No  Endocrine Excessive thirst?: No  Musculoskeletal Back pain?: No Joint pain?: No  Neurological Headaches?: No Dizziness?: No  Psychologic Depression?: No Anxiety?: No  Physical Exam: BP (!) 142/94  (BP Location: Right Arm, Patient Position: Sitting, Cuff Size: Large)   Pulse 71   Ht  (1.727 m)   Wt 210 lb 9.6 oz (95.5 kg)   SpO2 99%   BMI 32.02 kg/m   Constitutional:  Alert and oriented, No acute distress.  Accompanied by wife today.  He reports he drinks primarily water. HEENT: [place] AT, moist mucus membranes.  Trachea midline, no masses. Cardiovascular: No clubbing, cyanosis, or edema. Respiratory: Normal respiratory effort, no increased work of breathing. GI: Abdomen is soft, nontender, nondistended, no abdominal masses GU: No CVA tenderness Lymph: No cervical or inguinal lymphadenopathy. Skin: No rashes, bruises or suspicious lesions. Neurologic: Grossly intact, no focal deficits, moving all 4 extremities. Psychiatric: Normal mood and affect.  Laboratory Data: Creatinine 1.07/2017  Urinalysis Results for orders placed or performed in visit on 10/07/17  Microscopic Examination  Result Value Ref Range   WBC, UA 6-10 (A) 0 - 5 /hpf   RBC, UA 3-10 (A) 0 - 2 /hpf   Epithelial Cells (non renal) None seen 0 - 10 /hpf   Casts Present (A) None seen /lpf   Cast Type Hyaline casts N/A   Mucus, UA Present (A) Not Estab.   Bacteria, UA Few (A) None seen/Few  Urinalysis, Complete  Result Value Ref Range   Specific Gravity, UA 1.020 1.005 - 1.030   pH, UA 5.5 5.0 - 7.5   Color, UA Yellow Yellow   Appearance Ur Clear Clear   Leukocytes, UA Negative Negative   Protein, UA 3+ (A) Negative/Trace   Glucose, UA Negative Negative   Ketones, UA Negative Negative   RBC, UA 2+ (A) Negative   Bilirubin, UA Negative Negative   Urobilinogen, Ur 0.2 0.2 - 1.0 mg/dL   Nitrite, UA Negative Negative   Microscopic Examination See below:     Pertinent Imaging: Result Impression  -- 4 mm partially obstructing left proximal ureteral stone with associated proximal mild hydroureteronephrosis and perinephric fat stranding. -- 1.9 cm hypoattenuating lesion within the right hepatic lobe which  is incompletely evaluated. Recommend follow-up with contrasted CT, MRI, or ultrasound. -- Multiple hypoattenuating lesions within the right kidney, measuring up to 2.0 cm, which are incompletely evaluated. Statistically, these likely represent renal cysts. -- Hepatic steatosis.  The findings of this study were discussed via telephone with Dr. Ulla Gallo [facility] by Dr. Arnaldo Natal at 09/12/2017 8:50 AM.   Result Narrative  EXAM: CT abdomen and pelvis without contrast DATE: 09/12/2017 8:24 AM ACCESSION: [id number] DICTATED: 09/12/2017 8:31 AM INTERPRETATION LOCATION: [facility]  CLINICAL INDICATION: 52 years old Male with ABDOMINAL PAIN, (specify site in comments)- left sided, ? mild hydro on POCUS-  COMPARISON: None  TECHNIQUE: A spiral CT scan was obtained without IV contrast from the top of the kidneys through the entire bladder.Images were reconstructed in the axial plane. Coronal and sagittal reformatted images were also provided for further evaluation.  FINDINGS:Evaluation of the solid  organs and vasculature is limited in the absence of intravenous contrast.  LOWER CHEST: No pleural effusion. No pericardial effusion. Minimal subsegmental atelectasis. No pulmonary nodules.  ABDOMEN/PELVIS:  HEPATOBILIARY: Segmental hepatic steatosis. There is a 1.9 cm hypoattenuating lesion within the right hepatic lobe (2:32). No biliary ductal dilatation. Gallbladder demonstrates hyperattenuating material in the dependent portions likely representing small stones and/or sludge. PANCREAS: Unremarkable. SPLEEN: Unremarkable. ADRENAL GLANDS: Unremarkable. KIDNEYS/URETERS: There is a 4 mm stone within the proximal left ureter (2:64) with associated mild hydroureteronephrosis and perinephric fat stranding. There are multiple hypoattenuating lesions within the right kidney, the largest measuring approximately 2.0 cm in the upper pole (2:51) upper pole of the right kidney.  BLADDER: Partially  distended, otherwise unremarkable. BOWEL/PERITONEUM/RETROPERITONEUM: No bowel obstruction. No acute inflammatory process. No ascites. Colonic diverticulosis without evidence of diverticulitis. The appendix is unremarkable. VASCULATURE: Abdominal aorta within normal limits for patient's age. Unremarkable inferior vena cava. LYMPH NODES: No adenopathy. REPRODUCTIVE ORGANS: Unremarkable.  BONES/SOFT TISSUES: Multilevel degenerative disc disease with vacuum disc phenomenon. There is mild retrolisthesis of L5 on S1. No suspicious osseous lesions.    Unable to review CT scan personally.  Report reviewed.  Assessment & Plan:    1. Left ureteral stone Small 4 mm left proximal ureteral stone with no further pain x3 weeks Statistically, he is likely already passed the stone KUB today to confirm this-we will call him with results If he develops any further flank pain, advised to return We discussed general stone prevention techniques including drinking plenty water with goal of producing 2.5 L urine daily, increased citric acid intake, avoidance of high oxalate containing foods, and decreased salt intake.  Information about dietary recommendations given today. - Urinalysis, Complete - DG Abd 1 View; Future - CULTURE, URINE COMPREHENSIVE  2. Microscopic hematuria Likely secondary to #1 We will arrange for patient to drop off another urine in 1 month to ensure that this resolves  3. Renal cyst Per report, likely renal cyst which should require no further treatment or evaluation He does have further imaging for a #4, we can likely confirm that these lesions on the report are indeed cysts suspected He is agreeable this plan  4. Liver cyst Incidental hypoattenuating liver lesion per CT scan report Discussed that likely this represents a cyst but he should have follow-up for this, advised him to follow-up with his PCP per radiology recommendations to order appropriate study   Vanna Scotland,  MD  [facility] [address] [place], [place] [address] [phone]   Addendum: KUB ordered as above.  Based on the KUB and urinalysis, it appears that he has a left proximal retained ureteral stone.  I called him later in the afternoon to discuss options moving forward.  Given these failed to pass this relatively small stone at least 3 weeks, and a high suspicion that he will require surgical intervention.  We discussed his options including shockwave lithotripsy and ureteroscopy.  Risks and benefits of each were discussed in detail as well as the efficacy rate of each.  He is most interested in shockwave lithotripsy as a primary intervention.  He understands risk of bleeding, infection, damage to surrounding structures, hematoma, and procedure failure amongst others.  All questions were answered.  He will present to the office on Monday to fill out paperwork for this procedure.  He will hold all NSAIDs in the meantime.  Vanna Scotland, MD

## 2017-10-08 DIAGNOSIS — E139 Other specified diabetes mellitus without complications: Secondary | ICD-10-CM

## 2017-10-08 HISTORY — DX: Other specified diabetes mellitus without complications: E13.9

## 2017-10-10 ENCOUNTER — Other Ambulatory Visit: Payer: Self-pay | Admitting: Radiology

## 2017-10-10 ENCOUNTER — Telehealth: Payer: Self-pay | Admitting: Radiology

## 2017-10-10 DIAGNOSIS — N201 Calculus of ureter: Secondary | ICD-10-CM

## 2017-10-10 LAB — CULTURE, URINE COMPREHENSIVE

## 2017-10-10 NOTE — Telephone Encounter (Signed)
Patient came to office to fill out ESWL paperwork. Instructions given. Questions answered. Pt voices understanding.

## 2017-10-10 NOTE — Telephone Encounter (Signed)
-----   Message from Vanna ScotlandAshley Brandon, MD sent at 10/08/2017  2:01 PM EDT ----- Hyden Soley, I talked to the sky after hours and he wants to have shockwave on Thursday.  I will fill out the paperwork Monday or Tuesday.  As for left proximal stone.  Please call him on Monday morning and arrange for him to fill out the packet.

## 2017-10-11 ENCOUNTER — Other Ambulatory Visit: Payer: Self-pay | Admitting: Radiology

## 2017-10-11 ENCOUNTER — Encounter: Payer: Self-pay | Admitting: *Deleted

## 2017-10-11 DIAGNOSIS — N201 Calculus of ureter: Secondary | ICD-10-CM

## 2017-10-13 ENCOUNTER — Encounter: Payer: Self-pay | Admitting: *Deleted

## 2017-10-13 ENCOUNTER — Encounter
Admission: RE | Disposition: A | Discharge status: Home / Self Care | Payer: Self-pay | Source: Ambulatory Visit | Attending: Urology

## 2017-10-13 ENCOUNTER — Ambulatory Visit: Payer: Managed Care, Other (non HMO)

## 2017-10-13 ENCOUNTER — Telehealth: Payer: Self-pay | Admitting: Urology

## 2017-10-13 ENCOUNTER — Other Ambulatory Visit: Payer: Self-pay

## 2017-10-13 ENCOUNTER — Ambulatory Visit
Admission: RE | Admit: 2017-10-13 | Discharge: 2017-10-13 | Disposition: A | Discharge status: Home / Self Care | Payer: Managed Care, Other (non HMO) | Source: Ambulatory Visit | Attending: Urology | Admitting: Urology

## 2017-10-13 DIAGNOSIS — N201 Calculus of ureter: Secondary | ICD-10-CM

## 2017-10-13 DIAGNOSIS — K760 Fatty (change of) liver, not elsewhere classified: Secondary | ICD-10-CM | POA: Diagnosis not present

## 2017-10-13 DIAGNOSIS — Z79899 Other long term (current) drug therapy: Secondary | ICD-10-CM | POA: Insufficient documentation

## 2017-10-13 DIAGNOSIS — Z7984 Long term (current) use of oral hypoglycemic drugs: Secondary | ICD-10-CM | POA: Diagnosis not present

## 2017-10-13 DIAGNOSIS — K7689 Other specified diseases of liver: Secondary | ICD-10-CM | POA: Diagnosis not present

## 2017-10-13 DIAGNOSIS — I10 Essential (primary) hypertension: Secondary | ICD-10-CM | POA: Insufficient documentation

## 2017-10-13 DIAGNOSIS — N281 Cyst of kidney, acquired: Secondary | ICD-10-CM | POA: Diagnosis not present

## 2017-10-13 DIAGNOSIS — E119 Type 2 diabetes mellitus without complications: Secondary | ICD-10-CM | POA: Diagnosis not present

## 2017-10-13 DIAGNOSIS — N132 Hydronephrosis with renal and ureteral calculous obstruction: Secondary | ICD-10-CM | POA: Diagnosis present

## 2017-10-13 DIAGNOSIS — K219 Gastro-esophageal reflux disease without esophagitis: Secondary | ICD-10-CM | POA: Diagnosis not present

## 2017-10-13 HISTORY — PX: EXTRACORPOREAL SHOCK WAVE LITHOTRIPSY: SHX1557

## 2017-10-13 HISTORY — DX: Other specified diabetes mellitus without complications: E13.9

## 2017-10-13 HISTORY — DX: Plantar fascial fibromatosis: M72.2

## 2017-10-13 HISTORY — DX: Liver disease, unspecified: K76.9

## 2017-10-13 HISTORY — DX: Other seasonal allergic rhinitis: J30.2

## 2017-10-13 LAB — GLUCOSE, CAPILLARY: Glucose-Capillary: 134 mg/dL — ABNORMAL HIGH (ref 65–99)

## 2017-10-13 SURGERY — LITHOTRIPSY, ESWL
Anesthesia: Moderate Sedation | Laterality: Left

## 2017-10-13 MED ORDER — ONDANSETRON HCL 4 MG/2ML IJ SOLN
4.0000 mg | Freq: Once | INTRAMUSCULAR | Status: AC | PRN
  Administered 2017-10-13: 4 mg via INTRAVENOUS

## 2017-10-13 MED ORDER — CIPROFLOXACIN HCL 500 MG PO TABS
ORAL_TABLET | ORAL | Status: AC
  Filled 2017-10-13: qty 1

## 2017-10-13 MED ORDER — DIPHENHYDRAMINE HCL 25 MG PO CAPS
25.0000 mg | ORAL_CAPSULE | ORAL | Status: AC
  Administered 2017-10-13: 25 mg via ORAL

## 2017-10-13 MED ORDER — ONDANSETRON HCL 4 MG/2ML IJ SOLN
INTRAMUSCULAR | Status: AC
  Filled 2017-10-13: qty 2

## 2017-10-13 MED ORDER — DIPHENHYDRAMINE HCL 25 MG PO CAPS
ORAL_CAPSULE | ORAL | Status: AC
  Filled 2017-10-13: qty 1

## 2017-10-13 MED ORDER — SODIUM CHLORIDE 0.9 % IV SOLN
INTRAVENOUS | Status: DC
  Administered 2017-10-13: 08:00:00 via INTRAVENOUS

## 2017-10-13 MED ORDER — CIPROFLOXACIN HCL 500 MG PO TABS
500.0000 mg | ORAL_TABLET | ORAL | Status: AC
  Administered 2017-10-13: 500 mg via ORAL

## 2017-10-13 MED ORDER — HYDROCODONE-ACETAMINOPHEN 5-325 MG PO TABS: 1.0000 | ORAL_TABLET | Freq: Four times a day (QID) | ORAL | 0 refills | Status: DC | PRN

## 2017-10-13 MED ORDER — DIAZEPAM 5 MG PO TABS
ORAL_TABLET | ORAL | Status: AC
  Filled 2017-10-13: qty 2

## 2017-10-13 MED ORDER — DIAZEPAM 5 MG PO TABS
10.0000 mg | ORAL_TABLET | ORAL | Status: AC
  Administered 2017-10-13: 10 mg via ORAL

## 2017-10-13 NOTE — Interval H&P Note (Signed)
History and Physical Interval Note:  10/13/2017 9:56 AM  Pedro Gardner  has presented today for surgery, with the diagnosis of kidney stone  The various methods of treatment have been discussed with the patient and family. After consideration of risks, benefits and other options for treatment, the patient has consented to  Procedure(s): EXTRACORPOREAL SHOCK WAVE LITHOTRIPSY (ESWL) (Left) as a surgical intervention .  The patient's history has been reviewed, patient examined, no change in status, stable for surgery.  I have reviewed the patient's chart and labs.  Questions were answered to the patient's satisfaction.     Vanna ScotlandAshley Hovanes Hymas

## 2017-10-13 NOTE — Telephone Encounter (Signed)
Patient had a procedure with Dr. Apolinar JunesBrandon today.  Patient was told to continue to take Flomax.  The Rx was prescribed by the ER doctor and he is out of the medication.    Patient is requesting a new prescription for Flomax to be sent to the [facility] in [place] on [address].

## 2017-10-13 NOTE — Discharge Instructions (Signed)
AMBULATORY SURGERY  DISCHARGE INSTRUCTIONS   1) The drugs that you were given will stay in your system until tomorrow so for the next 24 hours you should not:  A) Drive an automobile B) Make any legal decisions C) Drink any alcoholic beverage   2) You may resume regular meals tomorrow.  Today it is better to start with liquids and gradually work up to solid foods.  You may eat anything you prefer, but it is better to start with liquids, then soup and crackers, and gradually work up to solid foods.   3) Please notify your doctor immediately if you have any unusual bleeding, trouble breathing, redness and pain at the surgery site, drainage, fever, or pain not relieved by medication.    4) Additional Instructions:   Please contact your physician with any problems or Same Day Surgery at [phone], Monday through Friday 6 am to 4 pm, or [place] [facility] [place] Main number at [phone].See [facility] discharge instructions in chart.  

## 2017-10-14 ENCOUNTER — Encounter: Payer: Self-pay | Admitting: Urology

## 2017-10-14 MED ORDER — TAMSULOSIN HCL 0.4 MG PO CAPS: 0.4000 mg | ORAL_CAPSULE | Freq: Every day | ORAL | 6 refills | Status: DC

## 2017-10-14 NOTE — Telephone Encounter (Signed)
Script sent  

## 2017-10-14 NOTE — Addendum Note (Signed)
Addended by: Martha ClanWATTS, Patton Swisher M on: 10/14/2017 04:07 PM   Modules accepted: Orders

## 2017-11-01 NOTE — Progress Notes (Deleted)
11/02/2017 10:17 PM   Pedro Gardner November 02, 1965 [id number]  Referring provider: Marina Goodell, MD [address] [place], [place] [address]  No chief complaint on file.   HPI: Patient is a 52 year old Caucasian male who is s/p ESWL for a left ureteral stone.  Background history 52 year old male who presents today for follow-up after ER visit for left proximal ureteral calculus.  Mr. Pedro Gardner was seen and evaluated in the emergency room and given 10/2017 with acute onset left flank pain.  UA showed large amount of blood but otherwise no evidence of infection.  Noncontrast CT scan showed a 4 mm left proximal ureteral calculus which appeared to be partially obstructing per the report.  Based on the dictation, there is no additional nonobstructing stones.  He does have bilateral presumed renal cysts as well as a  lesion in his liver.  He reports that he has not had any pain in 3 weeks.  He has not seen a stone pass but has not been straining his urine.  No gross hematuria or dysuria.  No previous history of kidney stones.  He drinks primarily water.  On occasion, he does note that his urine is dark in color and more concentrated.  Patient underwent left ESWL for a left ureteral stone.  His post procedural course was as expected and uneventful.    KUB on 11/02/2017 noted ***.   Today, ***.  His UA today ***.    PMH: Past Medical History:  Diagnosis Date  . Allergic rhinitis   . Diabetes mellitus without complication (HCC)   . Fatty liver   . GERD (gastroesophageal reflux disease)   . Hypertension    not currently being treated for this  . Liver lesion 2019  . Plantar fasciitis   . Renal cysts and diabetes syndrome (HCC) 10/2017  . Seasonal allergies   . Sleep apnea     Surgical History: Past Surgical History:  Procedure Laterality Date  . BACK SURGERY    . COLONOSCOPY WITH PROPOFOL N/A 06/11/2016   Procedure: COLONOSCOPY WITH PROPOFOL;  Surgeon: Christena Deem, MD;   Location: [facility]COPY;  Service: Endoscopy;  Laterality: N/A;  . ELBOW SURGERY Right   . EXTRACORPOREAL SHOCK WAVE LITHOTRIPSY Left 10/13/2017   Procedure: EXTRACORPOREAL SHOCK WAVE LITHOTRIPSY (ESWL);  Surgeon: Vanna Scotland, MD;  Location: [facility] ORS;  Service: Urology;  Laterality: Left;  . SPINE SURGERY      Home Medications:  Allergies as of 11/02/2017   No Known Allergies     Medication List        Accurate as of 11/01/17 10:17 PM. Always use your most recent med list.          cyanocobalamin 100 MCG tablet Take by mouth.   HYDROcodone-acetaminophen 5-325 MG tablet Commonly known as:  NORCO/VICODIN Take 1-2 tablets by mouth every 6 (six) hours as needed for moderate pain.   ibuprofen 600 MG tablet Commonly known as:  ADVIL,MOTRIN Take 600 mg by mouth every 6 (six) hours as needed. for pain   loratadine 10 MG tablet Commonly known as:  CLARITIN Take 10 mg by mouth daily.   Magnesium 250 MG Tabs Take by mouth.   metFORMIN 500 MG tablet Commonly known as:  GLUCOPHAGE Take 500 mg by mouth 2 (two) times daily with a meal.   omeprazole 10 MG capsule Commonly known as:  PRILOSEC Take 10 mg by mouth daily.   tamsulosin 0.4 MG Caps capsule Commonly known as:  FLOMAX Take 1 capsule (0.4 mg total) by mouth daily.       Allergies: No Known Allergies  Family History: Family History  Adopted: Yes    Social History:  reports that he has never smoked. He uses smokeless tobacco. He reports that he drinks alcohol. He reports that he does not use drugs.  ROS:                                        Physical Exam: There were no vitals taken for this visit.  Constitutional:  Alert and oriented, No acute distress.  Accompanied by wife today.  He reports he drinks primarily water. HEENT: [place] AT, moist mucus membranes.  Trachea midline, no masses. Cardiovascular: No clubbing, cyanosis, or edema. Respiratory: Normal respiratory effort, no  increased work of breathing. GI: Abdomen is soft, nontender, nondistended, no abdominal masses GU: No CVA tenderness Lymph: No cervical or inguinal lymphadenopathy. Skin: No rashes, bruises or suspicious lesions. Neurologic: Grossly intact, no focal deficits, moving all 4 extremities. Psychiatric: Normal mood and affect. ***  Constitutional: Well nourished. Alert and oriented, No acute distress. HEENT: [place] AT, moist mucus membranes. Trachea midline, no masses. Cardiovascular: No clubbing, cyanosis, or edema. Respiratory: Normal respiratory effort, no increased work of breathing. GI: Abdomen is soft, non tender, non distended, no abdominal masses. Liver and spleen not palpable.  No hernias appreciated.  Stool sample for occult testing is not indicated.   GU: No CVA tenderness.  No bladder fullness or masses.  Patient with circumcised/uncircumcised phallus. ***Foreskin easily retracted***  Urethral meatus is patent.  No penile discharge. No penile lesions or rashes. Scrotum without lesions, cysts, rashes and/or edema.  Testicles are located scrotally bilaterally. No masses are appreciated in the testicles. Left and right epididymis are normal. Rectal: Patient with  normal sphincter tone. Anus and perineum without scarring or rashes. No rectal masses are appreciated. Prostate is approximately *** grams, *** nodules are appreciated. Seminal vesicles are normal. Skin: No rashes, bruises or suspicious lesions. Lymph: No cervical or inguinal adenopathy. Neurologic: Grossly intact, no focal deficits, moving all 4 extremities. Psychiatric: Normal mood and affect.   Laboratory Data: Creatinine 1.07/2017  Urinalysis ***  Pertinent Imaging: ***  Assessment & Plan:    1. Left ureteral stone Small 4 mm left proximal ureteral stone with no further pain x3 weeks Statistically, he is likely already passed the stone KUB today to confirm this-we will call him with results If he develops any further  flank pain, advised to return We discussed general stone prevention techniques including drinking plenty water with goal of producing 2.5 L urine daily, increased citric acid intake, avoidance of high oxalate containing foods, and decreased salt intake.  Information about dietary recommendations given today. - Urinalysis, Complete - DG Abd 1 View; Future - CULTURE, URINE COMPREHENSIVE  2. Microscopic hematuria Likely secondary to #1 We will arrange for patient to drop off another urine in 1 month to ensure that this resolves  3. Renal cyst Per report, likely renal cyst which should require no further treatment or evaluation He does have further imaging for a #4, we can likely confirm that these lesions on the report are indeed cysts suspected He is agreeable this plan  4. Liver cyst Incidental hypoattenuating liver lesion per CT scan report Discussed that likely this represents a cyst but he should have follow-up for this, advised him to  follow-up with his PCP per radiology recommendations to order appropriate study   Michiel Cowboy, [facility] [address] [place], [place] [address] [phone]   Addendum: KUB ordered as above.  Based on the KUB and urinalysis, it appears that he has a left proximal retained ureteral stone.  I called him later in the afternoon to discuss options moving forward.  Given these failed to pass this relatively small stone at least 3 weeks, and a high suspicion that he will require surgical intervention.  We discussed his options including shockwave lithotripsy and ureteroscopy.  Risks and benefits of each were discussed in detail as well as the efficacy rate of each.  He is most interested in shockwave lithotripsy as a primary intervention.  He understands risk of bleeding, infection, damage to surrounding structures, hematoma, and procedure failure amongst others.  All questions were answered.  He will  present to the office on Monday to fill out paperwork for this procedure.  He will hold all NSAIDs in the meantime.  Jori Thrall, PA-C

## 2017-11-02 ENCOUNTER — Encounter: Payer: Self-pay | Admitting: Urology

## 2017-11-02 ENCOUNTER — Ambulatory Visit: Payer: Managed Care, Other (non HMO) | Admitting: Urology

## 2017-12-21 NOTE — Progress Notes (Signed)
12/22/2017 4:24 PM   Pedro Gardner April 08, 1966 [id number]  Referring provider: Marina GoodellFeldpausch, Dale E, MD [address] [place], [place] [id number]  Chief Complaint  Patient presents with  . Nephrolithiasis    HPI: 52 year old male who presents today for follow-up after ER visit for left proximal ureteral calculus.    Pedro Gardner was seen and evaluated in the emergency room and given 10/2017 with acute onset left flank pain.  UA showed large amount of blood but otherwise no evidence of infection.  Noncontrast CT scan showed a 4 mm left proximal ureteral calculus which appeared to be partially obstructing per the report.  Based on the dictation, there is no additional nonobstructing stones.   He does have bilateral presumed renal cysts as well as a  lesion in his liver.  No previous history of kidney stones.  He underwent left ESWL on 10/13/2017.  His post procedural course was as expected and uneventful.    Today, he is asymptomatic.  Patient denies any gross hematuria, dysuria or suprapubic/flank pain.  Patient denies any fevers, chills, nausea or vomiting.  His UA is negative for AMH.     PMH: Past Medical History:  Diagnosis Date  . Allergic rhinitis   . Diabetes mellitus without complication (HCC)   . Fatty liver   . GERD (gastroesophageal reflux disease)   . Hypertension    not currently being treated for this  . Liver lesion 2019  . Plantar fasciitis   . Renal cysts and diabetes syndrome (HCC) 10/2017  . Seasonal allergies   . Sleep apnea     Surgical History: Past Surgical History:  Procedure Laterality Date  . BACK SURGERY    . COLONOSCOPY WITH PROPOFOL N/A 06/11/2016   Procedure: COLONOSCOPY WITH PROPOFOL;  Surgeon: Christena DeemMartin U Skulskie, MD;  [facility];  Service: Endoscopy;  Laterality: N/A;  . ELBOW SURGERY Right   . EXTRACORPOREAL SHOCK WAVE LITHOTRIPSY Left 10/13/2017   Procedure: EXTRACORPOREAL SHOCK WAVE LITHOTRIPSY (ESWL);  Surgeon: Vanna ScotlandBrandon,  Ashley, MD;  Location: [facility] ORS;  Service: Urology;  Laterality: Left;  . SPINE SURGERY      Home Medications:  Allergies as of 12/22/2017   No Known Allergies     Medication List        Accurate as of 12/22/17  4:24 PM. Always use your most recent med list.          cyanocobalamin 100 MCG tablet Take by mouth.   HYDROcodone-acetaminophen 5-325 MG tablet Commonly known as:  NORCO/VICODIN Take 1-2 tablets by mouth every 6 (six) hours as needed for moderate pain.   ibuprofen 600 MG tablet Commonly known as:  ADVIL,MOTRIN Take 600 mg by mouth every 6 (six) hours as needed. for pain   lisinopril 5 MG tablet Commonly known as:  PRINIVIL,ZESTRIL   loratadine 10 MG tablet Commonly known as:  CLARITIN Take 10 mg by mouth daily.   Magnesium 250 MG Tabs Take by mouth.   metFORMIN 500 MG tablet Commonly known as:  GLUCOPHAGE Take 500 mg by mouth 2 (two) times daily with a meal.   omeprazole 10 MG capsule Commonly known as:  PRILOSEC Take 10 mg by mouth daily.   tamsulosin 0.4 MG Caps capsule Commonly known as:  FLOMAX Take 1 capsule (0.4 mg total) by mouth daily.       Allergies: No Known Allergies  Family History: Family History  Adopted: Yes    Social History:  reports that he has never smoked. He  uses smokeless tobacco. He reports that he drinks alcohol. He reports that he does not use drugs.  ROS: UROLOGY Frequent Urination?: No Hard to postpone urination?: No Burning/pain with urination?: No Get up at night to urinate?: No Leakage of urine?: No Urine stream starts and stops?: No Trouble starting stream?: No Do you have to strain to urinate?: No Blood in urine?: No Urinary tract infection?: No Sexually transmitted disease?: No Injury to kidneys or bladder?: No Painful intercourse?: No Weak stream?: No Erection problems?: No Penile pain?: No  Gastrointestinal Nausea?: No Vomiting?: No Indigestion/heartburn?: No Diarrhea?: No Constipation?:  No  Constitutional Fever: No Night sweats?: No Weight loss?: No Fatigue?: No  Skin Skin rash/lesions?: No Itching?: No  Eyes Blurred vision?: No Double vision?: No  Ears/Nose/Throat Sore throat?: No Sinus problems?: No  Hematologic/Lymphatic Swollen glands?: No Easy bruising?: No  Cardiovascular Leg swelling?: No Chest pain?: No  Respiratory Cough?: No Shortness of breath?: No  Endocrine Excessive thirst?: No  Musculoskeletal Back pain?: No Joint pain?: No  Neurological Headaches?: No Dizziness?: No  Psychologic Depression?: No Anxiety?: No  Physical Exam: BP (!) 149/85 (BP Location: Left Arm, Patient Position: Sitting, Cuff Size: Large)   Pulse 80   Ht 5\' 8"  (1.727 m)   Wt 204 lb (92.5 kg)   BMI 31.02 kg/m   Constitutional:  Alert and oriented, No acute distress.  Accompanied by wife today.  He reports he drinks primarily water. HEENT:  AT, moist mucus membranes.  Trachea midline, no masses. Cardiovascular: No clubbing, cyanosis, or edema. Respiratory: Normal respiratory effort, no increased work of breathing. GI: Abdomen is soft, nontender, nondistended, no abdominal masses GU: No CVA tenderness Lymph: No cervical or inguinal lymphadenopathy. Skin: No rashes, bruises or suspicious lesions. Neurologic: Grossly intact, no focal deficits, moving all 4 extremities. Psychiatric: Normal mood and affect.  Laboratory Data: Urinalysis Negative.  See Epic and HPI. I have reviewed the labs.  Pertinent Imaging: CLINICAL DATA:  Followup left-sided lithotripsy.  EXAM: ABDOMEN - 1 VIEW  COMPARISON:  10/13/2017  FINDINGS: No urinary tract calcifications seen presently either overlying the kidneys, along the course of the ureters or in the bladder. Small pelvic phleboliths as noted previously. Ordinary lower lumbar degenerative changes.  IMPRESSION: No evidence of residual urinary tract calcification.   Electronically Signed   By: Paulina FusiMark   Shogry M.D.   On: 12/22/2017 16:18 I have independently reviewed the films  Assessment & Plan:    1. Left ureteral stone Likely passed Not seen on today's KUB Will get RUS to confirm no hydronephrosis persists  2. Microscopic hematuria Likely secondary to #1 We will arrange for patient to drop off another urine in 1 month to ensure that this resolves  3. Renal cyst RUS pending  4. Liver cyst Patient states his PCP said something about a fatty liver and to go see someone, but he said when they called to "just forget it."   Abdominal ultrasound is pending - if cyst in the liver cannot be evaluated completely will notify PCP   Michiel CowboySHANNON Branson Kranz, [facility]  [facility] [address], [address] [place], [place] [phone]

## 2017-12-22 ENCOUNTER — Encounter: Payer: Self-pay | Admitting: Urology

## 2017-12-22 ENCOUNTER — Ambulatory Visit (INDEPENDENT_AMBULATORY_CARE_PROVIDER_SITE_OTHER): Payer: Managed Care, Other (non HMO) | Admitting: Urology

## 2017-12-22 ENCOUNTER — Ambulatory Visit
Admission: RE | Admit: 2017-12-22 | Discharge: 2017-12-22 | Disposition: A | Discharge status: Home / Self Care | Payer: Managed Care, Other (non HMO) | Source: Ambulatory Visit | Attending: Urology | Admitting: Urology

## 2017-12-22 VITALS — BP 149/85 | HR 80 | Ht 68.0 in | Wt 204.0 lb

## 2017-12-22 DIAGNOSIS — N281 Cyst of kidney, acquired: Secondary | ICD-10-CM

## 2017-12-22 DIAGNOSIS — R3129 Other microscopic hematuria: Secondary | ICD-10-CM | POA: Diagnosis not present

## 2017-12-22 DIAGNOSIS — N201 Calculus of ureter: Secondary | ICD-10-CM

## 2017-12-22 DIAGNOSIS — K7689 Other specified diseases of liver: Secondary | ICD-10-CM | POA: Diagnosis not present

## 2017-12-23 ENCOUNTER — Telehealth: Payer: Self-pay | Admitting: Family Medicine

## 2017-12-23 LAB — URINALYSIS, COMPLETE
BILIRUBIN UA: NEGATIVE
Ketones, UA: NEGATIVE
LEUKOCYTES UA: NEGATIVE
Nitrite, UA: NEGATIVE
RBC UA: NEGATIVE
Specific Gravity, UA: 1.015 (ref 1.005–1.030)
UUROB: 0.2 mg/dL (ref 0.2–1.0)
pH, UA: 6 (ref 5.0–7.5)

## 2017-12-23 LAB — MICROSCOPIC EXAMINATION
BACTERIA UA: NONE SEEN
Epithelial Cells (non renal): NONE SEEN /hpf (ref 0–10)
WBC UA: NONE SEEN /HPF (ref 0–5)

## 2017-12-23 NOTE — Telephone Encounter (Signed)
Patient notified and voiced understanding.

## 2017-12-23 NOTE — Telephone Encounter (Signed)
-----   Message from Harle BattiestShannon A McGowan, PA-C sent at 12/23/2017  7:38 AM EDT ----- Please let Mr. Pedro Gardner know that his urine was negative for blood.  I also added the an ultrasound to his liver and the same time we performed an ultrasound on his kidney so that we can take another look at the liver cyst that was seen on the previous CT.

## 2018-01-05 ENCOUNTER — Ambulatory Visit: Payer: Managed Care, Other (non HMO)

## 2018-01-05 ENCOUNTER — Ambulatory Visit
Admission: RE | Admit: 2018-01-05 | Discharge: 2018-01-05 | Disposition: A | Discharge status: Home / Self Care | Payer: Managed Care, Other (non HMO) | Source: Ambulatory Visit | Attending: Urology | Admitting: Urology

## 2018-01-05 DIAGNOSIS — K7689 Other specified diseases of liver: Secondary | ICD-10-CM | POA: Insufficient documentation

## 2018-01-05 DIAGNOSIS — R3129 Other microscopic hematuria: Secondary | ICD-10-CM | POA: Insufficient documentation

## 2018-01-05 DIAGNOSIS — N201 Calculus of ureter: Secondary | ICD-10-CM | POA: Diagnosis not present

## 2018-01-05 DIAGNOSIS — K8020 Calculus of gallbladder without cholecystitis without obstruction: Secondary | ICD-10-CM | POA: Insufficient documentation

## 2018-01-05 DIAGNOSIS — K760 Fatty (change of) liver, not elsewhere classified: Secondary | ICD-10-CM | POA: Diagnosis not present

## 2018-01-11 ENCOUNTER — Other Ambulatory Visit: Payer: Self-pay | Admitting: Urology

## 2018-01-11 DIAGNOSIS — K7689 Other specified diseases of liver: Secondary | ICD-10-CM

## 2018-01-11 NOTE — Progress Notes (Signed)
Orders in for ultrasound of the liver in three months.

## 2018-04-12 ENCOUNTER — Telehealth: Payer: Self-pay

## 2018-04-12 ENCOUNTER — Ambulatory Visit
Admission: RE | Admit: 2018-04-12 | Discharge: 2018-04-12 | Disposition: A | Discharge status: Home / Self Care | Payer: Managed Care, Other (non HMO) | Source: Ambulatory Visit | Attending: Urology | Admitting: Urology

## 2018-04-12 ENCOUNTER — Ambulatory Visit: Payer: Managed Care, Other (non HMO)

## 2018-04-12 DIAGNOSIS — N281 Cyst of kidney, acquired: Secondary | ICD-10-CM | POA: Diagnosis not present

## 2018-04-12 DIAGNOSIS — K760 Fatty (change of) liver, not elsewhere classified: Secondary | ICD-10-CM | POA: Diagnosis not present

## 2018-04-12 DIAGNOSIS — R161 Splenomegaly, not elsewhere classified: Secondary | ICD-10-CM | POA: Insufficient documentation

## 2018-04-12 DIAGNOSIS — K8020 Calculus of gallbladder without cholecystitis without obstruction: Secondary | ICD-10-CM | POA: Diagnosis not present

## 2018-04-12 DIAGNOSIS — K7689 Other specified diseases of liver: Secondary | ICD-10-CM

## 2018-04-12 NOTE — Telephone Encounter (Signed)
Called pt informed him of the information below. Pt gave verbal understanding.  

## 2018-04-12 NOTE — Telephone Encounter (Signed)
-----   Message from Harle BattiestShannon A McGowan, PA-C sent at 04/12/2018  4:39 PM EST ----- Please let Mr. Pedro Gardner know that his recent ultrasound shows the area in the liver/gallbladder has resolved, but his spleen is a little bit enlarged when compared to the previous study.  He needs to speak with his PCP regarding those findings.  It can be cause for a cancer.

## 2018-04-14 ENCOUNTER — Telehealth: Payer: Self-pay

## 2018-04-14 NOTE — Telephone Encounter (Signed)
-----   Message from Shannon A McGowan, PA-C sent at 04/12/2018  4:39 PM EST ----- Please let Pedro Gardner know that his recent ultrasound shows the area in the liver/gallbladder has resolved, but his spleen is a little bit enlarged when compared to the previous study.  He needs to speak with his PCP regarding those findings.  It can be cause for a cancer. 

## 2018-08-16 ENCOUNTER — Other Ambulatory Visit: Payer: Self-pay | Admitting: Family Medicine

## 2018-08-16 DIAGNOSIS — E119 Type 2 diabetes mellitus without complications: Secondary | ICD-10-CM

## 2018-08-16 DIAGNOSIS — R161 Splenomegaly, not elsewhere classified: Secondary | ICD-10-CM

## 2018-09-08 ENCOUNTER — Ambulatory Visit
Admission: RE | Admit: 2018-09-08 | Discharge: 2018-09-08 | Disposition: A | Discharge status: Home / Self Care | Payer: Managed Care, Other (non HMO) | Source: Ambulatory Visit | Attending: Family Medicine | Admitting: Family Medicine

## 2018-09-08 ENCOUNTER — Other Ambulatory Visit: Payer: Self-pay

## 2018-09-08 DIAGNOSIS — R161 Splenomegaly, not elsewhere classified: Secondary | ICD-10-CM

## 2018-09-08 DIAGNOSIS — E119 Type 2 diabetes mellitus without complications: Secondary | ICD-10-CM | POA: Diagnosis present

## 2019-01-14 IMAGING — US US PELVIS LIMITED
1 series · 12 of 12 positions shown · non-contrast
Comparison: None.

CLINICAL DATA: Patient with microscopic hematuria.

EXAM:
LIMITED ULTRASOUND OF PELVIS
TECHNIQUE: Limited transabdominal ultrasound examination of the pelvis was
performed.

[Series 1: us pelvis limited · 0.17mm/px · 12 acquisitions, 12 frames shown]
[im 1/12]
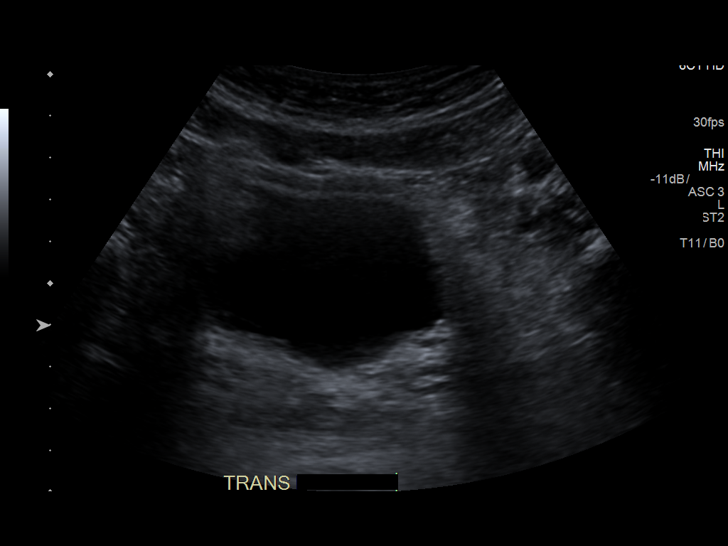
[im 2/12]
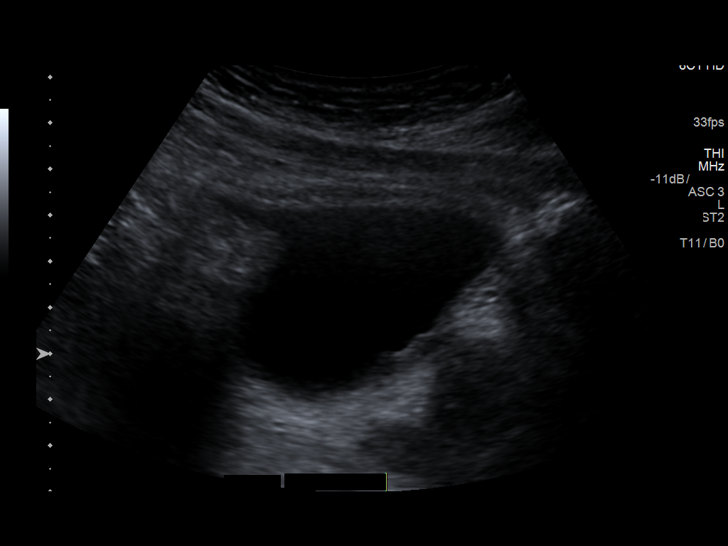
[im 3/12]
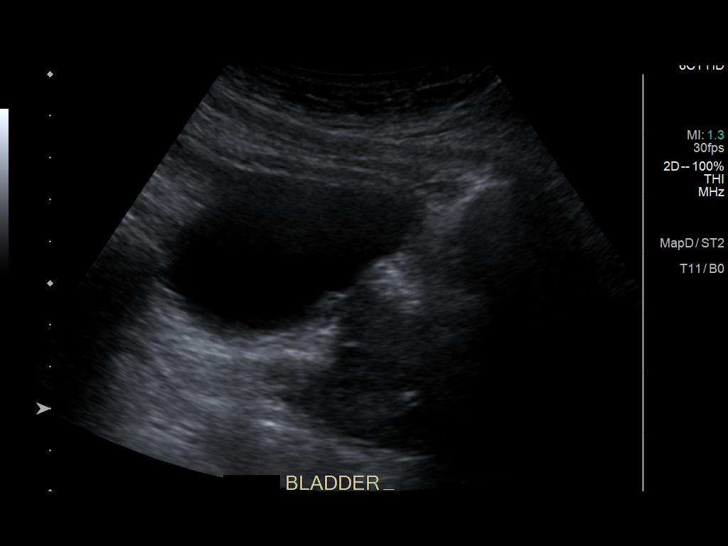
[im 4/12]
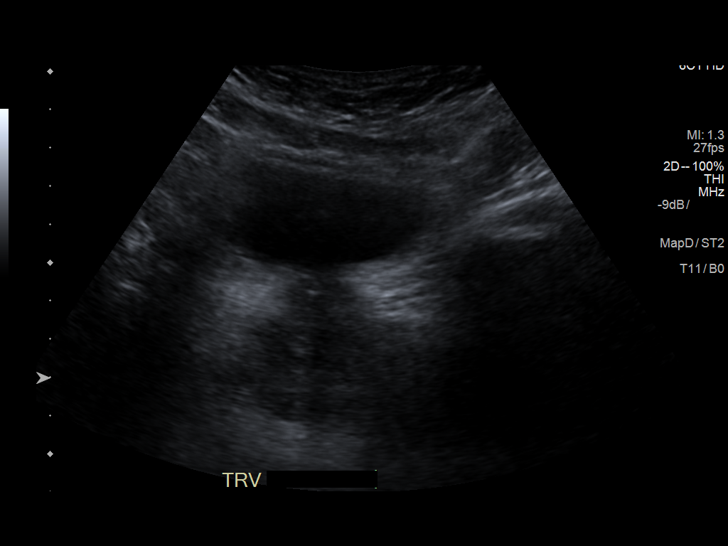
[im 5/12]
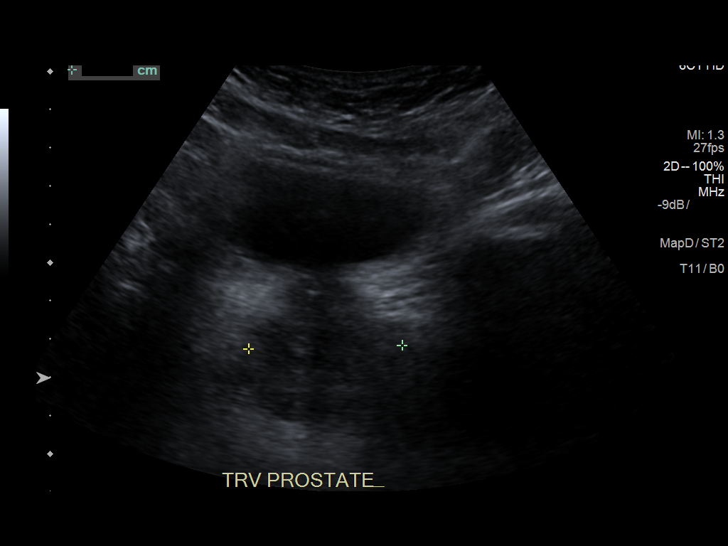
[im 6/12]
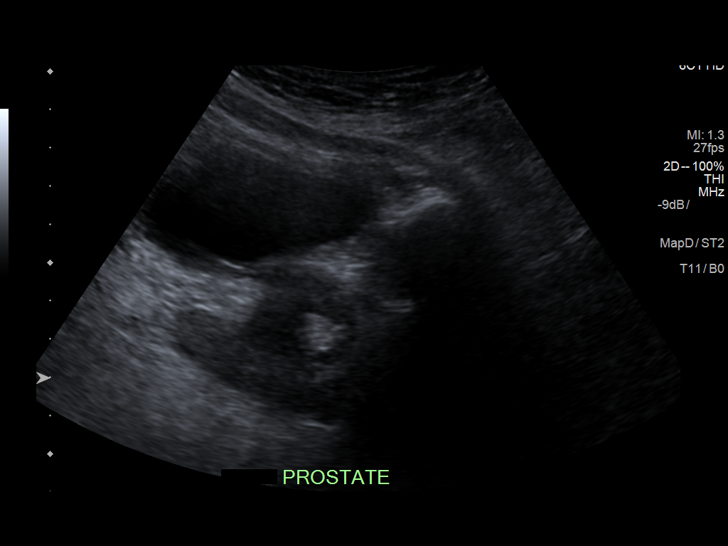
[im 7/12]
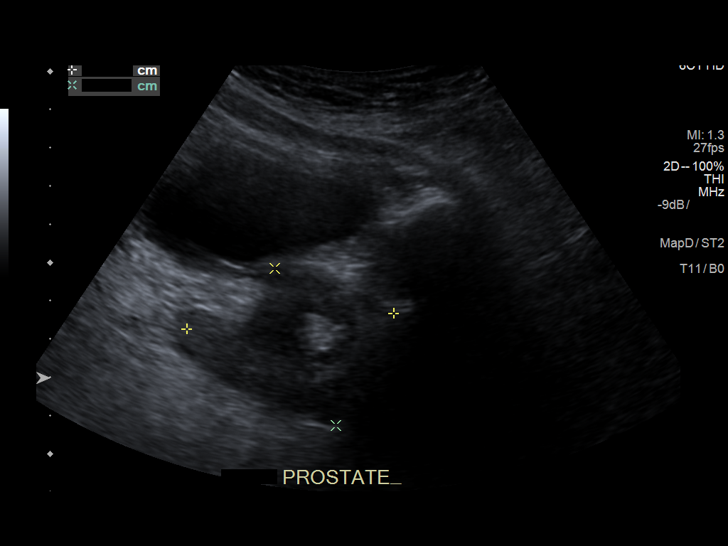
[im 8/12]
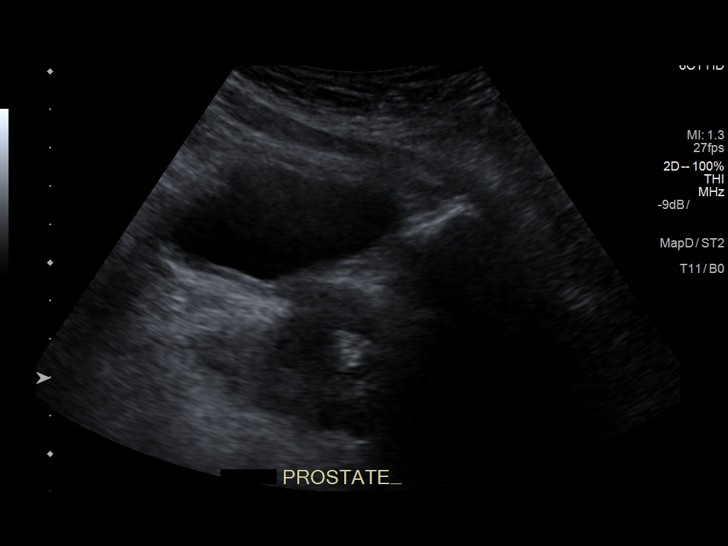
[im 9/12]
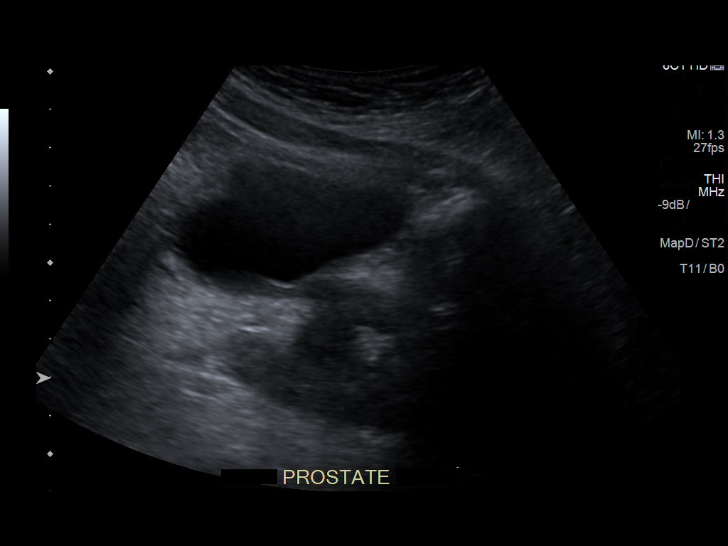
[im 10/12]
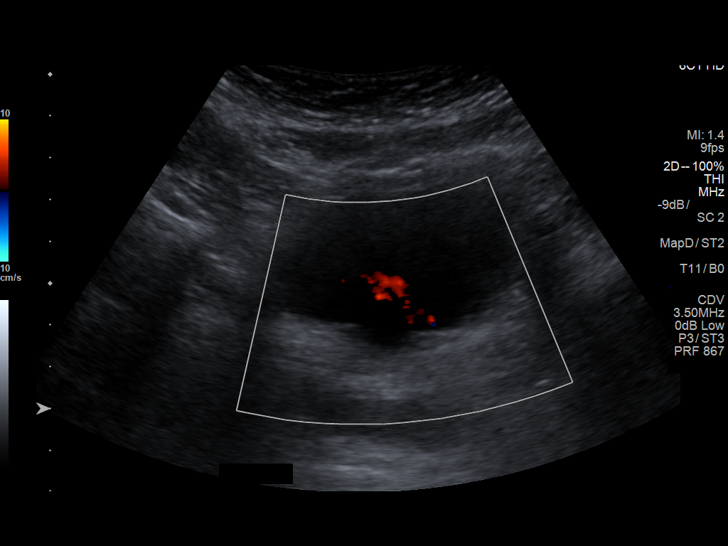
[im 11/12]
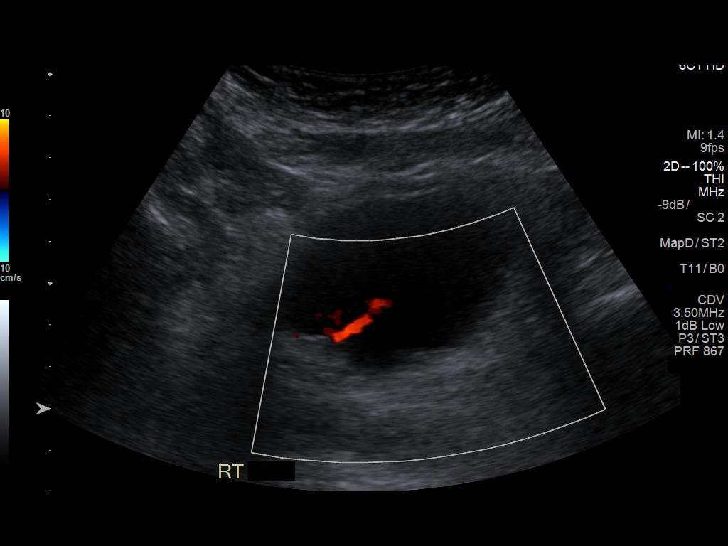
[im 12/12]
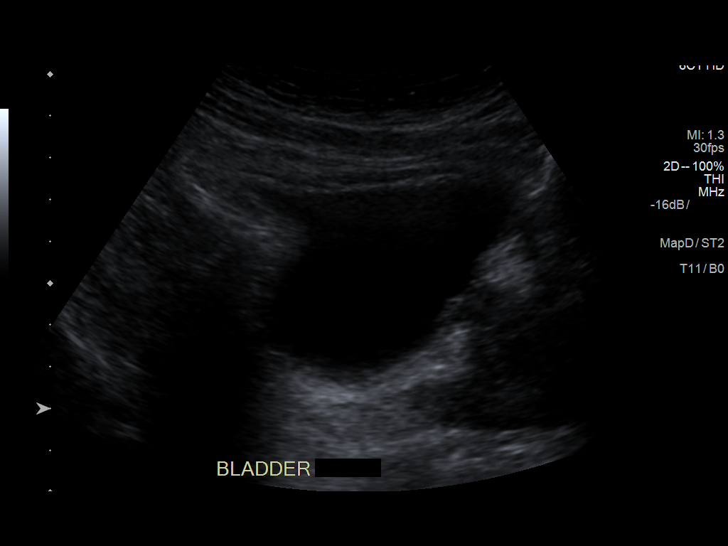

[12 of 12 positions shown; findings below may reference images not displayed]

FINDINGS: The urinary bladder has a normal appearance for the degree of
distention.

Other findings:  Heterogeneous prostate with calcifications.
IMPRESSION: Unremarkable sonographic appearance of the bladder.

## 2019-01-14 IMAGING — US US ABDOMEN COMPLETE
1 series · 13 of 25 positions shown · non-contrast
Comparison: None.

CLINICAL DATA: Patient with history of hepatic cyst. Microscopic
hematuria.

EXAM:
ABDOMEN ULTRASOUND COMPLETE

[Series 1: us abdomen complete · 0.22mm/px · 13 of 128 slices shown]
[im 1/128]
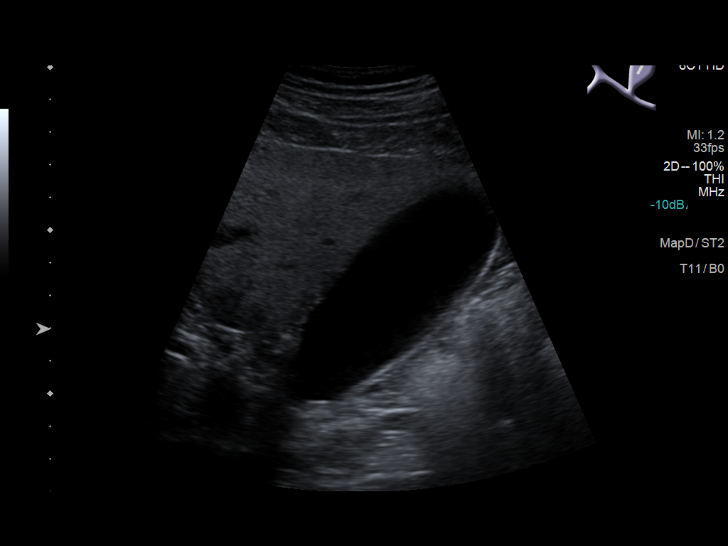
[im 11/128]
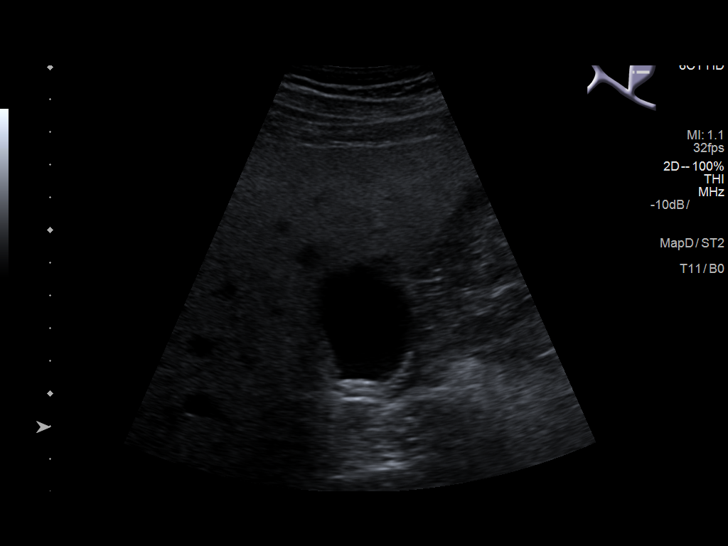
[im 22/128]
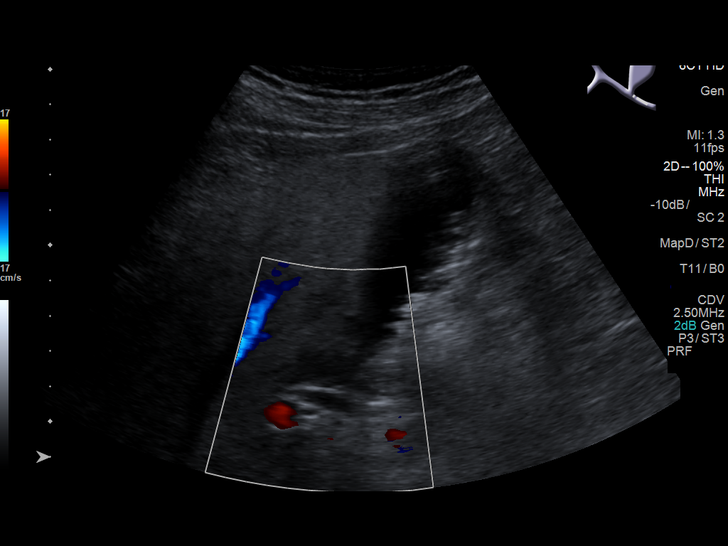
[im 32/128]
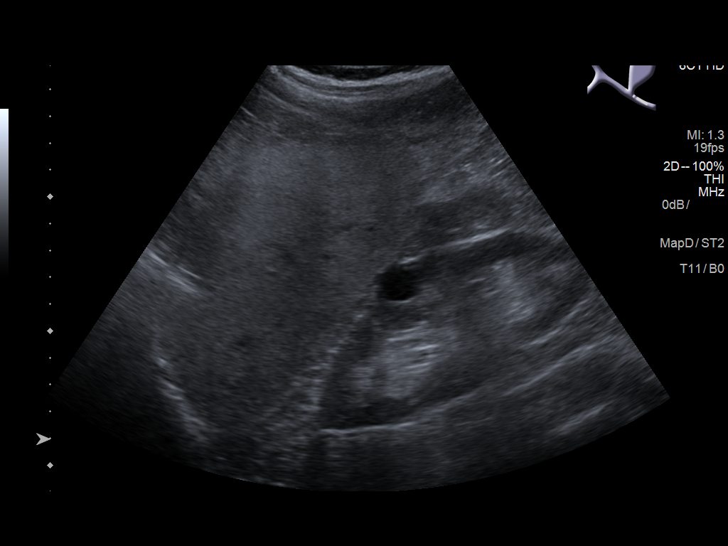
[im 43/128]
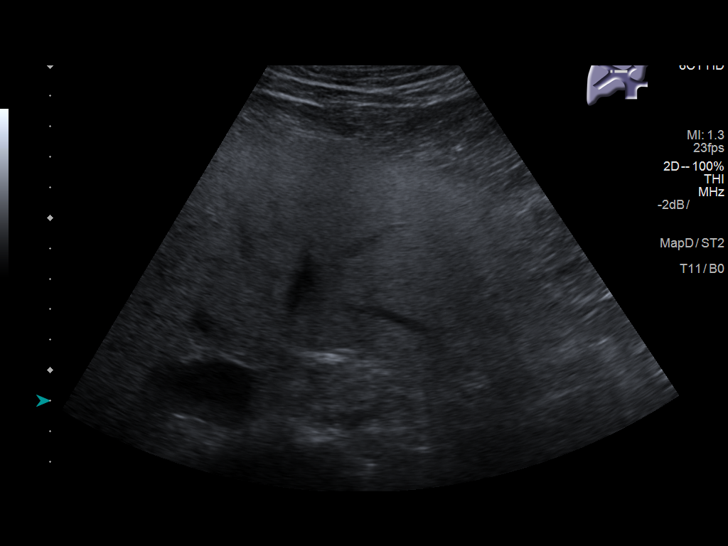
[im 53/128]
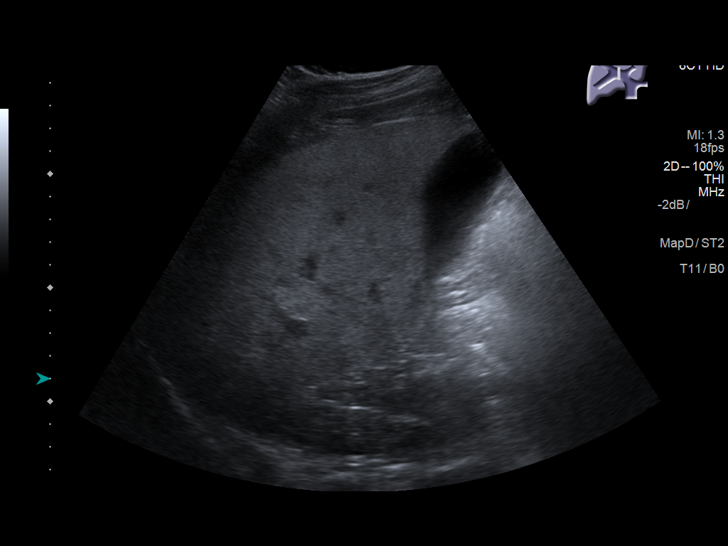
[im 64/128]
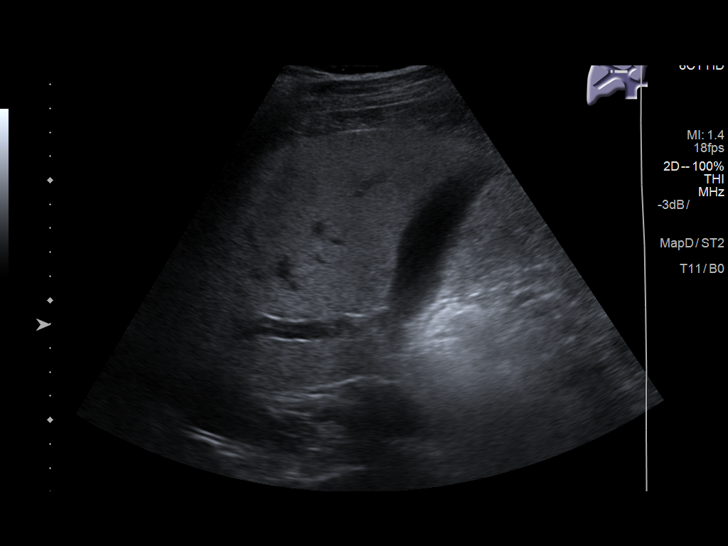
[im 75/128]
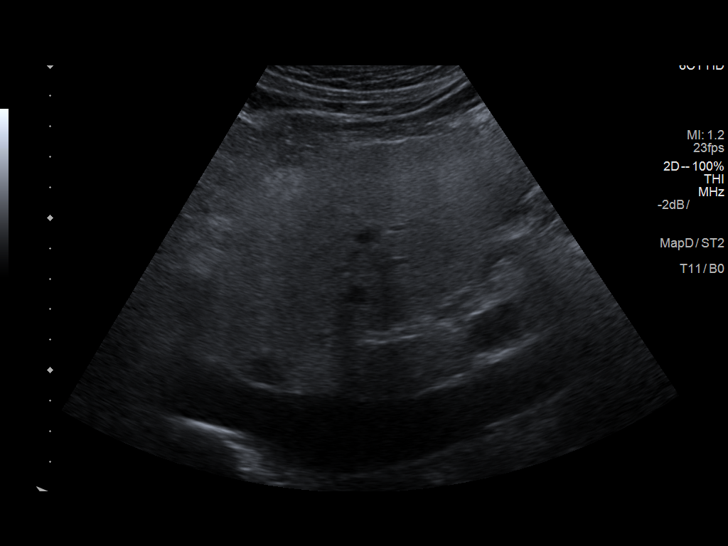
[im 85/128]
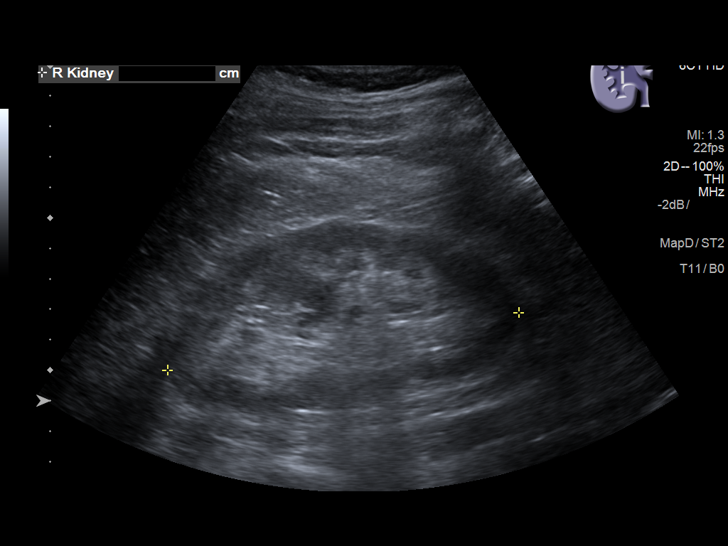
[im 96/128]
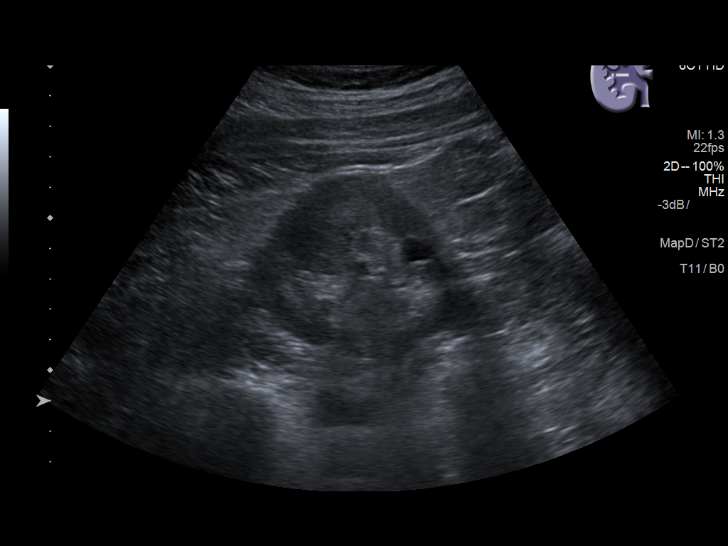
[im 106/128]
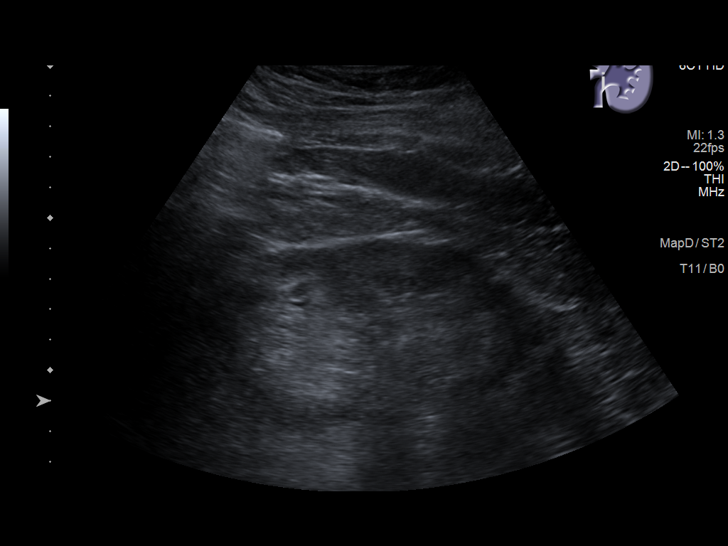
[im 117/128]
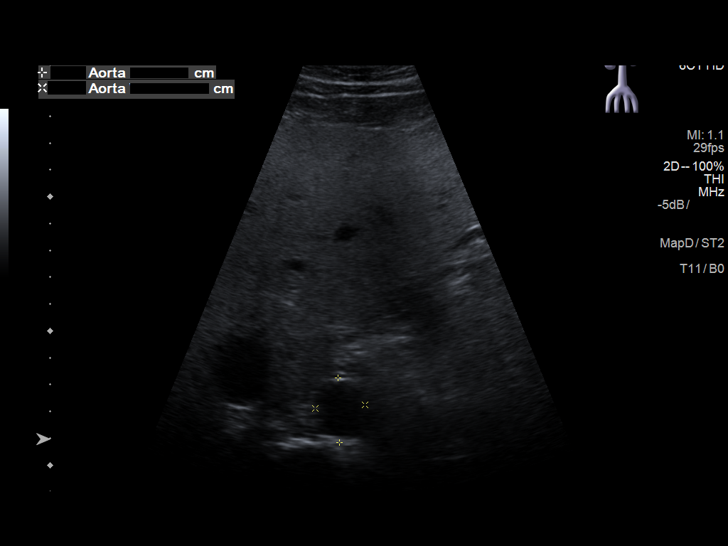
[im 128/128]
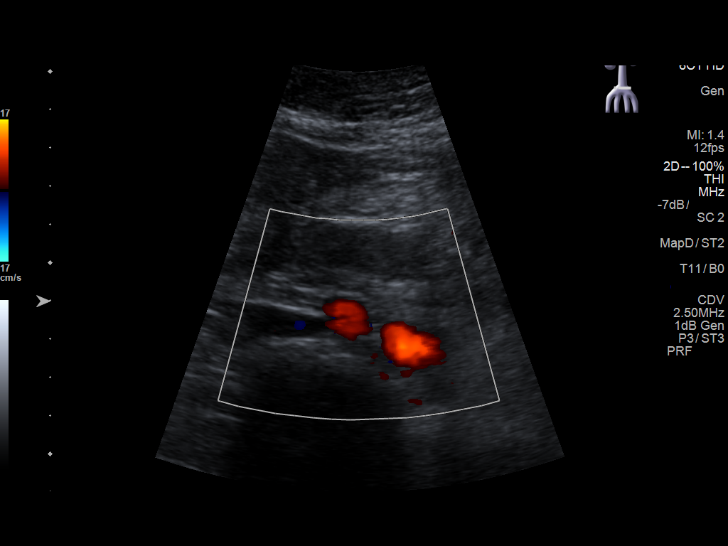

[13 of 25 positions shown; findings below may reference images not displayed]

FINDINGS: Gallbladder: Multiple small stones in the gallbladder lumen. No
gallbladder wall thickening or pericholecystic fluid. Negative
sonographic Murphy sign.

Common bile duct: Diameter: 5.6 mm

Liver: Diffusely increased in echogenicity. There is a 2.1 x 2.6 x
2.2 cm cyst within the liver. Additionally, adjacent to the
gallbladder, there is a small focal area of lower echogenicity which
may represent focal fatty sparing. Portal vein is patent on color
Doppler imaging with normal direction of blood flow towards the
liver.

IVC: No abnormality visualized.

Pancreas: Visualized portion unremarkable.

Spleen: Size and appearance within normal limits.

Right Kidney: Length: 11.7 cm. Echogenicity within normal limits. No
mass or hydronephrosis visualized. Multiple cysts.

Left Kidney: Length: 11.1 cm. Echogenicity within normal limits. No
mass or hydronephrosis visualized.

Abdominal aorta: No aneurysm visualized.

Other findings: None.
IMPRESSION: 1. Hepatic steatosis. Hypoechoic nodule adjacent to the gallbladder
fossa may represent focal fatty sparing. Recommend either follow-up
ultrasound in 2-3 months to assess for stability or definitive
characterization with abdominal MRI.
2. Cholelithiasis without secondary evidence to suggest acute
cholecystitis.
3. Hepatic cyst.

## 2019-03-08 IMAGING — CR DG ABDOMEN 1V
2 series · 2 of 2 positions shown · non-contrast
Comparison: None.

CLINICAL DATA: Pre lithotripsy

EXAM:
ABDOMEN - 1 VIEW

[abdomen kub (1 of 2)]
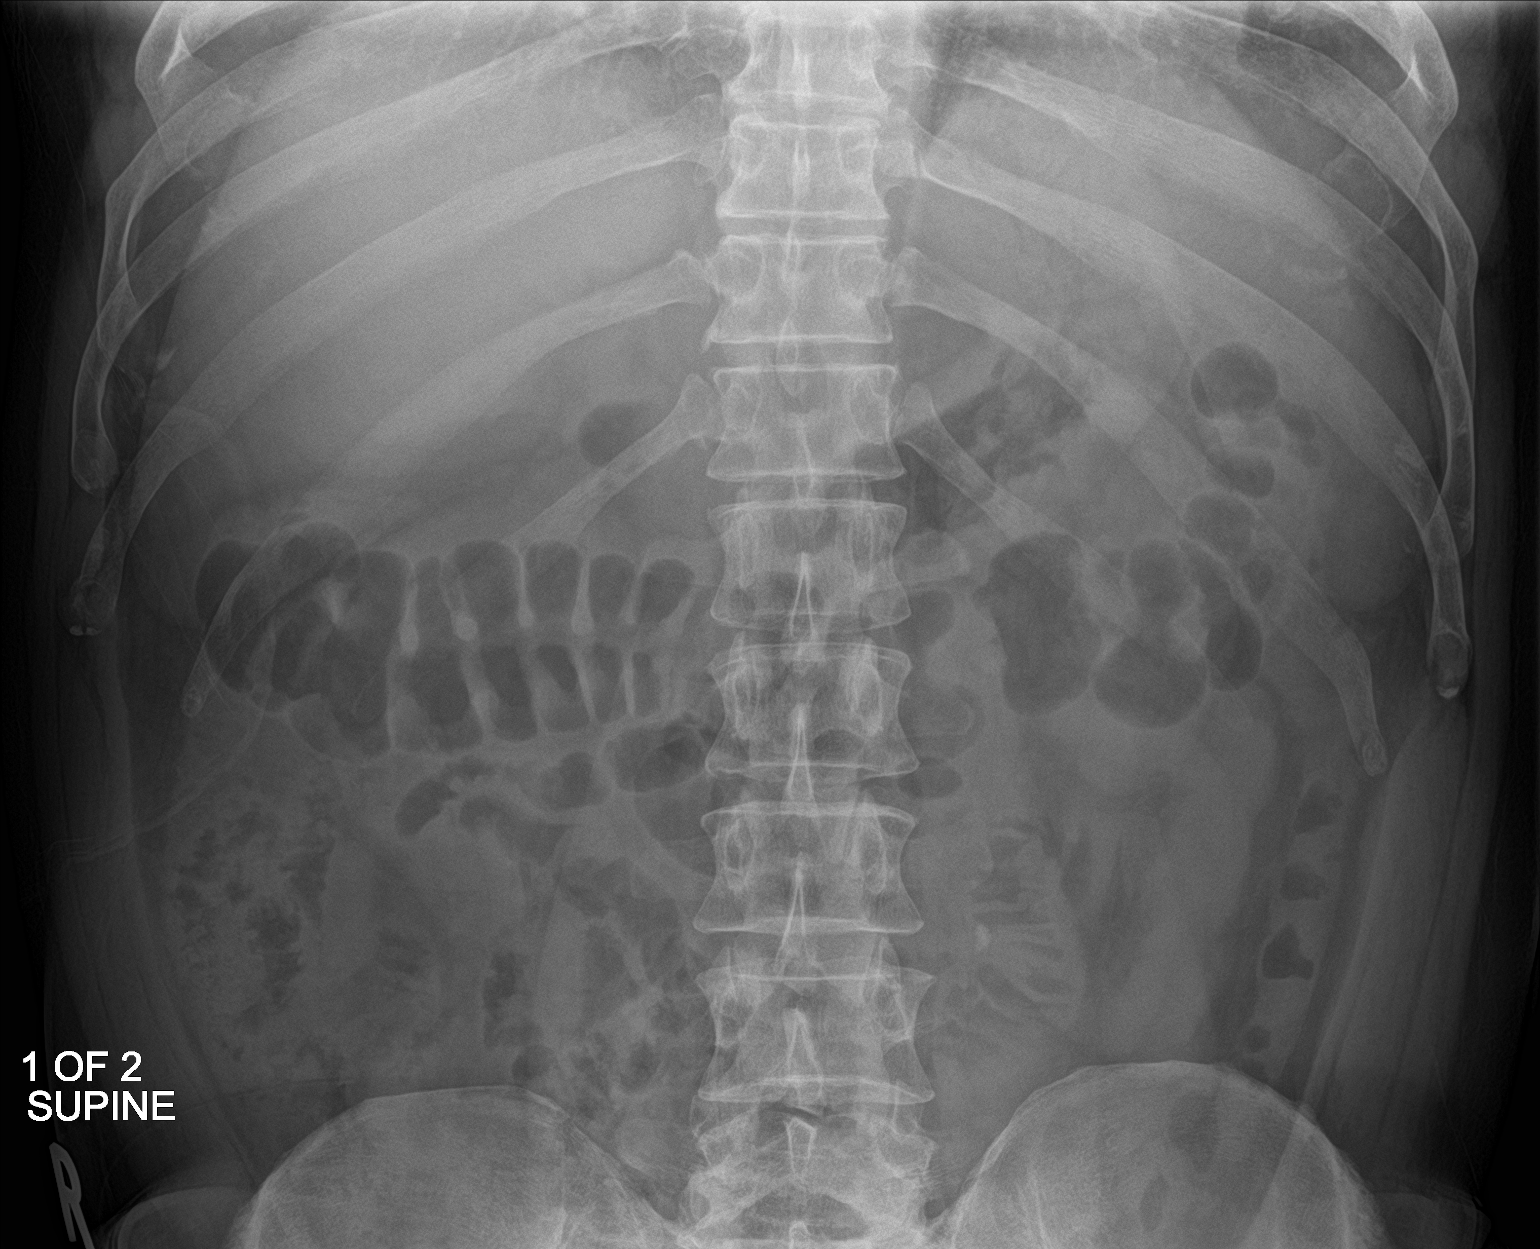

[abdomen kub (2 of 2)]
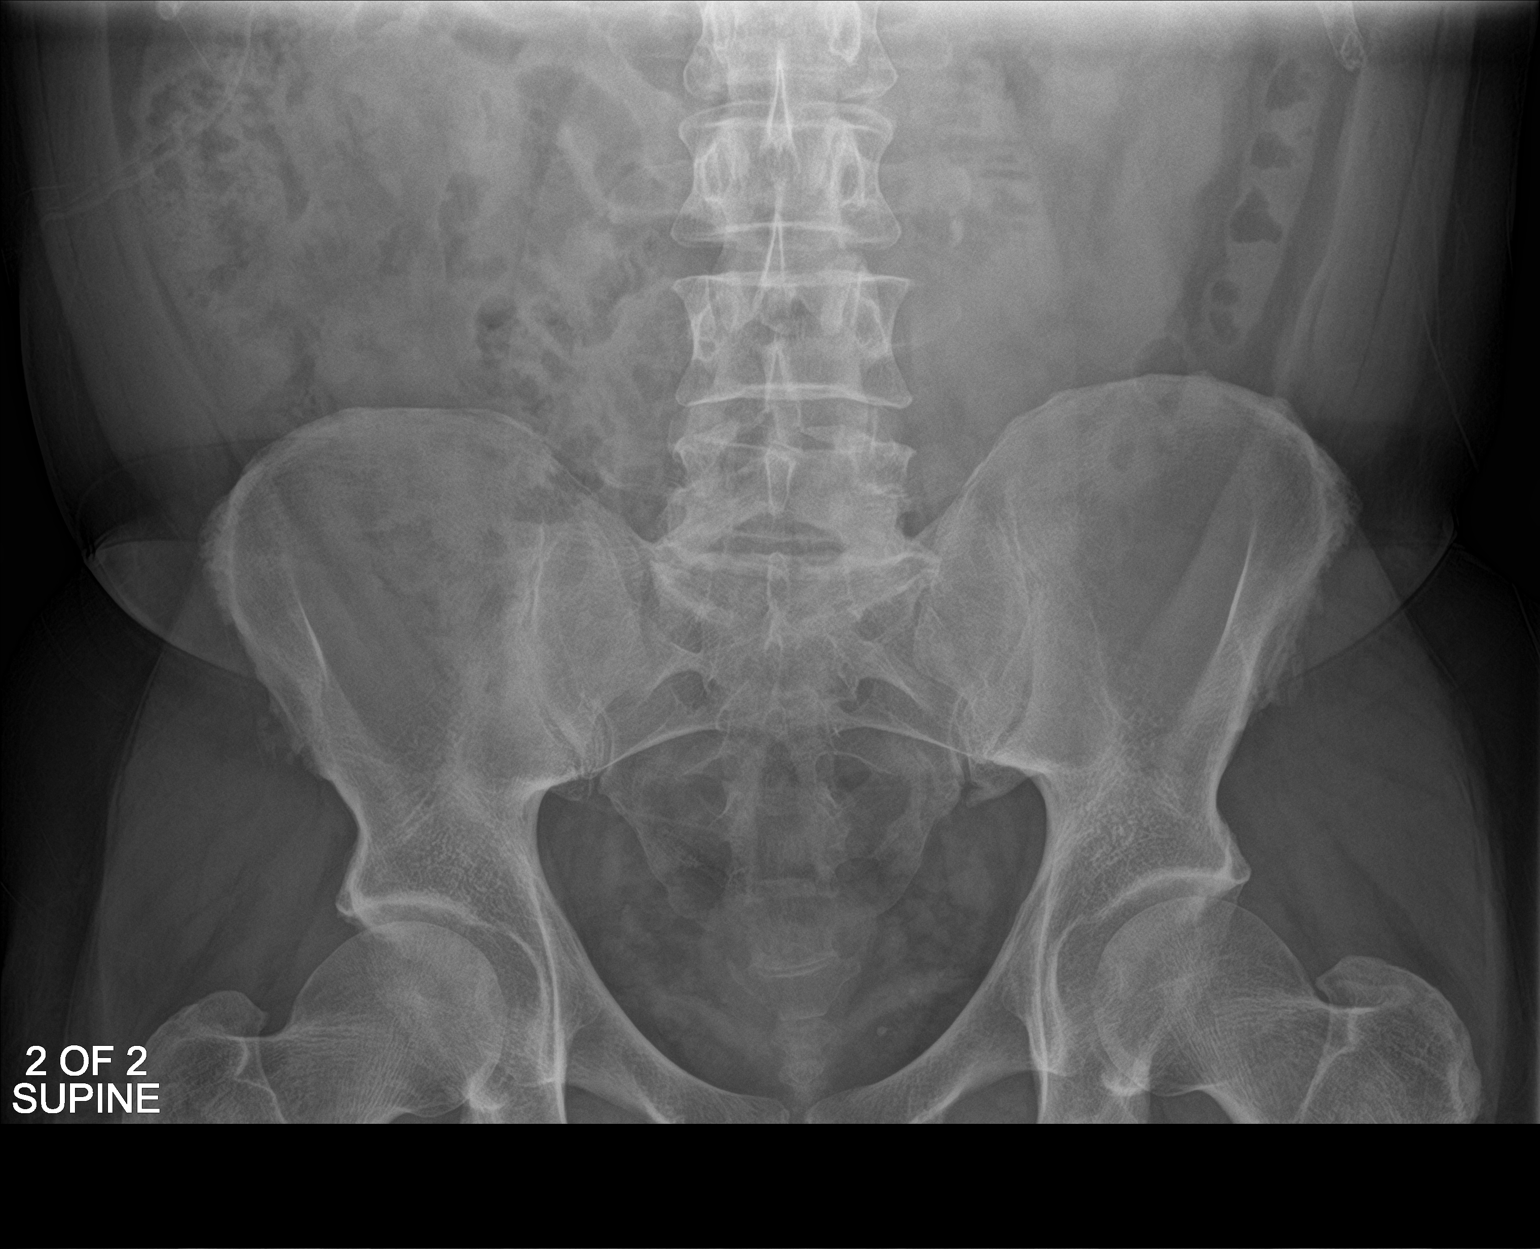

[2 of 2 positions shown; findings below may reference images not displayed]

FINDINGS: The 5 mm stone over the left flank, possibly ureteral, is stable.
Two calcifications in the left pelvis may be phleboliths. No other
ureteral stones noted. No obvious renal stones a bow although both
kidneys are obscured by bowel contents.
IMPRESSION: 1. Stable left flank stone, possibly ureteral.
2. Evaluation of the kidneys for stones is limited due to
overlapping bowel contents but none are seen.

## 2019-09-13 ENCOUNTER — Ambulatory Visit (INDEPENDENT_AMBULATORY_CARE_PROVIDER_SITE_OTHER): Payer: Managed Care, Other (non HMO) | Admitting: Urology

## 2019-09-13 ENCOUNTER — Other Ambulatory Visit: Payer: Self-pay | Admitting: Family Medicine

## 2019-09-13 ENCOUNTER — Ambulatory Visit
Admission: RE | Admit: 2019-09-13 | Discharge: 2019-09-13 | Disposition: A | Discharge status: Home / Self Care | Payer: Managed Care, Other (non HMO) | Source: Ambulatory Visit | Attending: Urology | Admitting: Urology

## 2019-09-13 ENCOUNTER — Encounter: Payer: Self-pay | Admitting: Urology

## 2019-09-13 ENCOUNTER — Other Ambulatory Visit: Payer: Self-pay

## 2019-09-13 VITALS — BP 126/83 | HR 63 | Ht 68.0 in | Wt 210.0 lb

## 2019-09-13 DIAGNOSIS — N201 Calculus of ureter: Secondary | ICD-10-CM

## 2019-09-13 DIAGNOSIS — R109 Unspecified abdominal pain: Secondary | ICD-10-CM

## 2019-09-13 DIAGNOSIS — Z87442 Personal history of urinary calculi: Secondary | ICD-10-CM

## 2019-09-14 LAB — URINALYSIS, COMPLETE
Bilirubin, UA: NEGATIVE
Glucose, UA: NEGATIVE
Ketones, UA: NEGATIVE
Leukocytes,UA: NEGATIVE
Nitrite, UA: NEGATIVE
RBC, UA: NEGATIVE
Specific Gravity, UA: 1.025 (ref 1.005–1.030)
Urobilinogen, Ur: 0.2 mg/dL (ref 0.2–1.0)
pH, UA: 6 (ref 5.0–7.5)

## 2019-09-14 LAB — MICROSCOPIC EXAMINATION: Bacteria, UA: NONE SEEN

## 2019-09-14 NOTE — Progress Notes (Signed)
09/13/2019 10:24 AM   Pedro Gardner 01-08-1966 [id number]  Referring provider: Sofie Hartigan, MD [place],  [place] [address]  Chief Complaint  Patient presents with  . Nephrolithiasis    HPI: Pedro Gardner is a 54 year old male with a history of nephrolithiasis who present today for back pain with his wife, Pedro Gardner.  We had last seen the patient in 09/2017 after Pedro Gardner had performed ESWL for a 4 mm left proximal ureteral stone.  Abdominal ultrasound 04/2018 was negative for hydronephrosis.  Follow up UA was negative for micro heme.  He states he has been having a two week history of bilateral flank pain.  He states the pain radiates between the right and left flank.  7/10 pain.  The pain is sharp.  He states the ibuprofen helps the pain.  He finds that bending over and sitting makes the pain worse.  Patient denies any modifying or aggravating factors.  Patient denies any gross hematuria, dysuria or suprapubic/flank pain.  Patient denies any fevers, chills, nausea or vomiting.  Patient denies any modifying or aggravating factors.  Patient denies any gross hematuria, dysuria or suprapubic/flank pain.  Patient denies any fevers, chills, nausea or vomiting.   His UA today is negative.  KUB today is negative for stones.     PMH: Past Medical History:  Diagnosis Date  . Allergic rhinitis   . Diabetes mellitus without complication ([facility])   . Fatty liver   . GERD (gastroesophageal reflux disease)   . Hypertension    not currently being treated for this  . Liver lesion 2019  . Plantar fasciitis   . Renal cysts and diabetes syndrome ([facility]) 10/2017  . Seasonal allergies   . Sleep apnea     Surgical History: Past Surgical History:  Procedure Laterality Date  . BACK SURGERY    . COLONOSCOPY WITH PROPOFOL N/A 06/11/2016   Procedure: COLONOSCOPY WITH PROPOFOL;  Surgeon: Pedro Sails, MD;  [facility] ENDOSCOPY;  Service: Endoscopy;  Laterality: N/A;  .  ELBOW SURGERY Right   . EXTRACORPOREAL SHOCK WAVE LITHOTRIPSY Left 10/13/2017   Procedure: EXTRACORPOREAL SHOCK WAVE LITHOTRIPSY (ESWL);  Surgeon: Pedro Espy, MD;  Location: [facility] ORS;  Service: Urology;  Laterality: Left;  . SPINE SURGERY      Home Medications:  Allergies as of 09/13/2019   No Known Allergies     Medication List       Accurate as of Sep 13, 2019 11:59 PM. If you have any questions, ask your nurse or doctor.        cyanocobalamin 100 MCG tablet Take by mouth.   HYDROcodone-acetaminophen 5-325 MG tablet Commonly known as: NORCO/VICODIN Take 1-2 tablets by mouth every 6 (six) hours as needed for moderate pain.   ibuprofen 600 MG tablet Commonly known as: ADVIL Take 600 mg by mouth every 6 (six) hours as needed. for pain   lisinopril 5 MG tablet Commonly known as: ZESTRIL   loratadine 10 MG tablet Commonly known as: CLARITIN Take 10 mg by mouth daily.   Magnesium 250 MG Tabs Take by mouth.   metFORMIN 500 MG tablet Commonly known as: GLUCOPHAGE Take 500 mg by mouth 2 (two) times daily with a meal.   omeprazole 10 MG capsule Commonly known as: PRILOSEC Take 10 mg by mouth daily.   rosuvastatin 5 MG tablet Commonly known as: CRESTOR Take 5 mg by mouth daily.   tamsulosin 0.4 MG Caps capsule Commonly known as:  FLOMAX Take 1 capsule (0.4 mg total) by mouth daily.       Allergies: No Known Allergies  Family History: Family History  Adopted: Yes    Social History:  reports that he has never smoked. He uses smokeless tobacco. He reports current alcohol use. He reports that he does not use drugs.  ROS: Pertinent ROS in HPI  Physical Exam: BP 126/83   Pulse 63   Ht 5\' 8"  (1.727 m)   Wt 210 lb (95.3 kg)   BMI 31.93 kg/m   Constitutional:  Well nourished. Alert and oriented, No acute distress. HEENT: [place] AT, mask in place.  Trachea midline Cardiovascular: No clubbing, cyanosis, or edema. Respiratory: Normal respiratory effort, no  increased work of breathing. Neurologic: Grossly intact, no focal deficits, moving all 4 extremities. Psychiatric: Normal mood and affect.  Laboratory Data: Urinalysis Component     Latest Ref Rng & Units 09/13/2019  Specific Gravity, UA     1.005 - 1.030 1.025  pH, UA     5.0 - 7.5 6.0  Color, UA     Yellow Yellow  Appearance Ur     Clear Clear  Leukocytes,UA     Negative Negative  Protein,UA     Negative/Trace 2+ (A)  Glucose, UA     Negative Negative  Ketones, UA     Negative Negative  RBC, UA     Negative Negative  Bilirubin, UA     Negative Negative  Urobilinogen, Ur     0.2 - 1.0 mg/dL 0.2  Nitrite, UA     Negative Negative  Microscopic Examination      See below:   Component     Latest Ref Rng & Units 09/13/2019  WBC, UA     0 - 5 /hpf 0-5  RBC     0 - 2 /hpf 0-2  Epithelial Cells (non renal)     0 - 10 /hpf 0-10  Casts     None seen /lpf Present (A)  Cast Type     N/A Hyaline casts  Bacteria, UA     None seen/Few None seen   I have reviewed the labs.   Pertinent Imaging: CLINICAL DATA:  History of kidney stone, left flank pain  EXAM: ABDOMEN - 1 VIEW  COMPARISON:  Ultrasound 04/12/2018, radiograph 12/22/2017  FINDINGS: Nonobstructed gas pattern. Phleboliths in the left pelvis. No radiopaque calculi over the kidneys.  IMPRESSION: Negative.   Electronically Signed   By: 12/24/2017 M.D.   On: 09/13/2019 17:55 I have independently reviewed the films and no stones appreciated.   Assessment & Plan:    1. Bilateral flank pain Muscle skeletal pain versus renal colic With recent KUB negative for nephrolithiasis and UA benign, it is more likely this is muscle skeletal pain but will obtain a CT for confirmation as he has a history of nephrolithiasis - Urinalysis, Complete - CULTURE, URINE COMPREHENSIVE - CT RENAL STONE STUDY; Future - RTC for report    Return for CT report .  These notes generated with voice recognition  software. I apologize for typographical errors.  11/13/2019, PA-C  [facility] [address]  [address] [place], [place] [phone]

## 2019-09-16 LAB — CULTURE, URINE COMPREHENSIVE

## 2019-09-18 ENCOUNTER — Other Ambulatory Visit: Payer: Self-pay

## 2019-09-18 ENCOUNTER — Ambulatory Visit
Admission: RE | Admit: 2019-09-18 | Discharge: 2019-09-18 | Disposition: A | Discharge status: Home / Self Care | Payer: Managed Care, Other (non HMO) | Source: Ambulatory Visit | Attending: Urology | Admitting: Urology

## 2019-09-18 DIAGNOSIS — R109 Unspecified abdominal pain: Secondary | ICD-10-CM | POA: Diagnosis not present

## 2019-09-19 ENCOUNTER — Ambulatory Visit (INDEPENDENT_AMBULATORY_CARE_PROVIDER_SITE_OTHER): Payer: Managed Care, Other (non HMO) | Admitting: Urology

## 2019-09-19 ENCOUNTER — Encounter: Payer: Self-pay | Admitting: Urology

## 2019-09-19 VITALS — BP 150/99 | HR 67 | Ht 68.0 in | Wt 210.0 lb

## 2019-09-19 DIAGNOSIS — N200 Calculus of kidney: Secondary | ICD-10-CM | POA: Diagnosis not present

## 2019-09-19 DIAGNOSIS — R932 Abnormal findings on diagnostic imaging of liver and biliary tract: Secondary | ICD-10-CM

## 2019-09-19 DIAGNOSIS — R109 Unspecified abdominal pain: Secondary | ICD-10-CM

## 2019-09-19 NOTE — Progress Notes (Signed)
09/19/2019 4:08 PM   Pedro Gardner March 11, 1966 [id number]  Referring provider: Marina Goodell, MD [address] [place],  [place] [address]  Chief Complaint  Patient presents with  . Other    HPI: Mr. Kutzer is a 54 year old male with a history of nephrolithiasis who present today to review his CT scan results.  Background history: We had last seen the patient in 09/2017 after Dr. Apolinar Junes had performed ESWL for a 4 mm left proximal ureteral stone.  Abdominal ultrasound 04/2018 was negative for hydronephrosis.  Follow up UA was negative for micro heme.    On 09/13/2019, he stated he had been having a two week history of bilateral flank pain.  The pain radiated between the right and left flank.  7/10 pain.  The pain was sharp. Stated the ibuprofen helps the pain.  He found that bending over and sitting made the pain worse.  Patient denied any modifying or aggravating factors.  Patient denied any gross hematuria, dysuria or suprapubic/flank pain.  Patient denied any fevers, chills, nausea or vomiting.  Patient denied any modifying or aggravating factors.  Patient denied any gross hematuria, dysuria or suprapubic/flank pain.  Patient denied any fevers, chills, nausea or vomiting.  His UA was negative.  KUB was negative for stones.   Urine culture was negative.  CT Renal stone study completed 09/18/2019 a few small fluid attenuation cysts are noted in the right kidney. A 2 mm calculus is noted in the lower pole of the left kidney. No evidence of ureteral calculi or hydronephrosis. Unremarkable unopacified urinary bladder.  Cholelithiasis. No radiographic evidence of cholecystitis.  Suspect hepatic cirrhosis. Suggest correlation with liver function tests and serology.  Today, he states he is not experiencing any pain.  The pain abated that night.  He did not see a fragment pass.  Patient denies any modifying or aggravating factors.  Patient denies any gross hematuria, dysuria or  suprapubic/flank pain.  Patient denies any fevers, chills, nausea or vomiting.    PMH: Past Medical History:  Diagnosis Date  . Allergic rhinitis   . Diabetes mellitus without complication ([facility])   . Fatty liver   . GERD (gastroesophageal reflux disease)   . Hypertension    not currently being treated for this  . Liver lesion 2019  . Plantar fasciitis   . Renal cysts and diabetes syndrome ([facility]) 10/2017  . Seasonal allergies   . Sleep apnea     Surgical History: Past Surgical History:  Procedure Laterality Date  . BACK SURGERY    . COLONOSCOPY WITH PROPOFOL N/A 06/11/2016   Procedure: COLONOSCOPY WITH PROPOFOL;  Surgeon: Christena Deem, MD;  Location[facility] ENDOSCOPY;  Service: Endoscopy;  Laterality: N/A;  . ELBOW SURGERY Right   . EXTRACORPOREAL SHOCK WAVE LITHOTRIPSY Left 10/13/2017   Procedure: EXTRACORPOREAL SHOCK WAVE LITHOTRIPSY (ESWL);  Surgeon: Vanna Scotland, MD;  Location: [facility] ORS;  Service: Urology;  Laterality: Left;  . SPINE SURGERY      Home Medications:  Allergies as of 09/19/2019   No Known Allergies     Medication List       Accurate as of Sep 19, 2019  4:08 PM. If you have any questions, ask your nurse or doctor.        STOP taking these medications   HYDROcodone-acetaminophen 5-325 MG tablet Commonly known as: NORCO/VICODIN Stopped by: Michiel Cowboy, PA-C     TAKE these medications   cyanocobalamin 100 MCG tablet Take by mouth.  ibuprofen 600 MG tablet Commonly known as: ADVIL Take 600 mg by mouth every 6 (six) hours as needed. for pain   lisinopril 5 MG tablet Commonly known as: ZESTRIL   loratadine 10 MG tablet Commonly known as: CLARITIN Take 10 mg by mouth daily.   Magnesium 250 MG Tabs Take by mouth.   metFORMIN 500 MG tablet Commonly known as: GLUCOPHAGE Take 500 mg by mouth 2 (two) times daily with a meal.   omeprazole 10 MG capsule Commonly known as: PRILOSEC Take 10 mg by mouth daily.   rosuvastatin 5 MG  tablet Commonly known as: CRESTOR Take 5 mg by mouth daily.   tamsulosin 0.4 MG Caps capsule Commonly known as: FLOMAX Take 1 capsule (0.4 mg total) by mouth daily.       Allergies: No Known Allergies  Family History: Family History  Adopted: Yes    Social History:  reports that he has never smoked. He uses smokeless tobacco. He reports current alcohol use. He reports that he does not use drugs.  ROS: Pertinent ROS in HPI  Physical Exam: BP (!) 150/99   Pulse 67   Ht 5\' 8"  (1.727 m)   Wt 210 lb (95.3 kg)   BMI 31.93 kg/m   Constitutional:  Well nourished. Alert and oriented, No acute distress. HEENT: [place] AT, mask in place Trachea midline Cardiovascular: No clubbing, cyanosis, or edema. Respiratory: Normal respiratory effort, no increased work of breathing. Neurologic: Grossly intact, no focal deficits, moving all 4 extremities. Psychiatric: Normal mood and affect.  Laboratory Data: Urinalysis Component     Latest Ref Rng & Units 09/13/2019  Specific Gravity, UA     1.005 - 1.030 1.025  pH, UA     5.0 - 7.5 6.0  Color, UA     Yellow Yellow  Appearance Ur     Clear Clear  Leukocytes,UA     Negative Negative  Protein,UA     Negative/Trace 2+ (A)  Glucose, UA     Negative Negative  Ketones, UA     Negative Negative  RBC, UA     Negative Negative  Bilirubin, UA     Negative Negative  Urobilinogen, Ur     0.2 - 1.0 mg/dL 0.2  Nitrite, UA     Negative Negative  Microscopic Examination      See below:   Component     Latest Ref Rng & Units 09/13/2019  WBC, UA     0 - 5 /hpf 0-5  RBC     0 - 2 /hpf 0-2  Epithelial Cells (non renal)     0 - 10 /hpf 0-10  Casts     None seen /lpf Present (A)  Cast Type     N/A Hyaline casts  Bacteria, UA     None seen/Few None seen   I have reviewed the labs.   Pertinent Imaging: CLINICAL DATA:  Left-sided flank and low back pain for 2 weeks. Nephrolithiasis.  EXAM: CT ABDOMEN AND PELVIS WITHOUT  CONTRAST  TECHNIQUE: Multidetector CT imaging of the abdomen and pelvis was performed following the standard protocol without IV contrast.  COMPARISON:  None.  FINDINGS: Lower chest: No acute findings.  Hepatobiliary: Tiny sub-cm cyst seen in dome of right hepatic lobe. No other liver lesions visualized on this unenhanced exam. Mild hypertrophy of caudate and left lobes is suspicious for cirrhosis. A few tiny calcified gallstones are seen, however there is no evidence of cholecystitis or biliary ductal dilatation.  Pancreas:  No mass or inflammatory process visualized on this unenhanced exam.  Spleen:  Within normal limits in size.  Adrenals/Urinary tract: A few small fluid attenuation cysts are noted in the right kidney. A 2 mm calculus is noted in the lower pole of the left kidney. No evidence of ureteral calculi or hydronephrosis. Unremarkable unopacified urinary bladder.  Stomach/Bowel: No evidence of obstruction, inflammatory process, or abnormal fluid collections. Normal appendix visualized.  Vascular/Lymphatic: No pathologically enlarged lymph nodes identified. No evidence of abdominal aortic aneurysm.  Reproductive:  No mass or other significant abnormality.  Other:  None.  Musculoskeletal:  No suspicious bone lesions identified.  IMPRESSION: 1. Tiny left renal calculus. No evidence of ureteral calculi, hydronephrosis, or other acute findings. 2. Cholelithiasis. No radiographic evidence of cholecystitis. 3. Suspect hepatic cirrhosis. Suggest correlation with liver function tests and serology.   Electronically Signed   By: Danae Orleans M.D.   On: 09/18/2019 16:28 I have independently reviewed the films with the patient.    Assessment & Plan:    1. Bilateral flank pain Resolved at this time Explained that the punctate stone located in the left kidney was not the source of his pain and that the stone did not need intervention  2.  Cholelithiasis Typically no intervention is warranted without evidence of cholecystitis  3. Abnormal liver on CT CT findings of cirrhosis Recent LFT's were WNL I have forwarded his CT results to Dr. Maryjane Hurter and advised Mr. Zou to contact his office for advice on what type of follow up is needed for this CT finding   Return if symptoms worsen or fail to improve.  These notes generated with voice recognition software. I apologize for typographical errors.  Michiel Cowboy, PA-C  [facility] [address]  [address] [place], [place] [address] [phone]

## 2019-10-15 ENCOUNTER — Other Ambulatory Visit: Payer: Self-pay | Admitting: Gastroenterology

## 2019-10-15 DIAGNOSIS — R935 Abnormal findings on diagnostic imaging of other abdominal regions, including retroperitoneum: Secondary | ICD-10-CM

## 2019-10-15 DIAGNOSIS — R7989 Other specified abnormal findings of blood chemistry: Secondary | ICD-10-CM

## 2019-10-26 ENCOUNTER — Other Ambulatory Visit: Payer: Self-pay | Admitting: Gastroenterology

## 2019-10-26 DIAGNOSIS — Z03821 Encounter for observation for suspected ingested foreign body ruled out: Secondary | ICD-10-CM

## 2019-10-29 ENCOUNTER — Other Ambulatory Visit: Payer: Self-pay

## 2019-10-29 ENCOUNTER — Ambulatory Visit
Admission: RE | Admit: 2019-10-29 | Discharge: 2019-10-29 | Disposition: A | Discharge status: Home / Self Care | Payer: Managed Care, Other (non HMO) | Source: Ambulatory Visit | Attending: Gastroenterology | Admitting: Gastroenterology

## 2019-10-29 DIAGNOSIS — Z03821 Encounter for observation for suspected ingested foreign body ruled out: Secondary | ICD-10-CM | POA: Insufficient documentation

## 2019-10-29 DIAGNOSIS — R935 Abnormal findings on diagnostic imaging of other abdominal regions, including retroperitoneum: Secondary | ICD-10-CM | POA: Diagnosis present

## 2019-10-29 DIAGNOSIS — R7989 Other specified abnormal findings of blood chemistry: Secondary | ICD-10-CM | POA: Insufficient documentation

## 2019-10-29 MED ORDER — GADOBUTROL 1 MMOL/ML IV SOLN
10.0000 mL | Freq: Once | INTRAVENOUS | Status: AC | PRN
  Administered 2019-10-29: 10 mL via INTRAVENOUS

## 2020-06-23 ENCOUNTER — Encounter: Payer: Self-pay | Admitting: Emergency Medicine

## 2020-06-23 ENCOUNTER — Ambulatory Visit
Admission: EM | Admit: 2020-06-23 | Discharge: 2020-06-23 | Disposition: A | Discharge status: Home / Self Care | Payer: Managed Care, Other (non HMO) | Attending: Internal Medicine | Admitting: Internal Medicine

## 2020-06-23 ENCOUNTER — Other Ambulatory Visit: Payer: Self-pay | Admitting: Internal Medicine

## 2020-06-23 ENCOUNTER — Other Ambulatory Visit: Payer: Self-pay

## 2020-06-23 DIAGNOSIS — R1010 Upper abdominal pain, unspecified: Secondary | ICD-10-CM | POA: Diagnosis not present

## 2020-06-23 DIAGNOSIS — Z77011 Contact with and (suspected) exposure to lead: Secondary | ICD-10-CM

## 2020-06-23 NOTE — ED Triage Notes (Addendum)
Patient c/o abdominal pain and vomiting that started on Saturday. 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 He states he started having a fever Saturday night. He reports his fever continued yesterday but last night resolved. He has continued to have abdominal pain. He reports that he had a small BM on Saturday and nothing since. Patient was positive for COVID 3 weeks ago.

## 2020-06-23 NOTE — ED Provider Notes (Signed)
[facility]    CSN: [id number] Arrival date & time: 06/23/20  1101      History   Chief Complaint Chief Complaint  Patient presents with  . Abdominal Pain    HPI Pedro Gardner is a 55 y.o. male who presents with onset of upper abdominal pain after 30 minutes of working in an old house where he was passing things out thorugh the window and the pain was chipping off and he is certain he inhaled some of it. Denies sanding or dust exposure directly from this old pained window. He woke up fine and had biscuit egg and cheese at home. Could not eat lunch or dinner that day due to abdominal pain and nausea.  Has had a fever of 102 the night the pain started and severe abd pain and vomited x 1 and lated all day yesterday. Took Tylenol and Aleeve, but no oral stomach meds. Has been constipated and not been hungry since Saturday mid day. Is up to date on his colonoscopy. Had hips and thigh aches today and was worse yesterday.  He had covid infection 21/2 weeks ago.  Past Medical History:  Diagnosis Date  . Allergic rhinitis   . Diabetes mellitus without complication ([facility])   . Fatty liver   . GERD (gastroesophageal reflux disease)   . Hypertension    not currently being treated for this  . Liver lesion 2019  . Plantar fasciitis   . Renal cysts and diabetes syndrome ([facility]) 10/2017  . Seasonal allergies   . Sleep apnea     Patient Active Problem List   Diagnosis Date Noted  . Controlled type 2 diabetes mellitus without complication, without long-term current use of insulin ([facility]) 02/16/2016    Past Surgical History:  Procedure Laterality Date  . BACK SURGERY    . COLONOSCOPY WITH PROPOFOL N/A 06/11/2016   Procedure: COLONOSCOPY WITH PROPOFOL;  Surgeon: Christena Deem, MD;  Location: [facility] ENDOSCOPY;  Service: Endoscopy;  Laterality: N/A;  . ELBOW SURGERY Right   . EXTRACORPOREAL SHOCK WAVE LITHOTRIPSY Left 10/13/2017   Procedure: EXTRACORPOREAL SHOCK WAVE LITHOTRIPSY  (ESWL);  Surgeon: Vanna Scotland, MD;  Location: [facility] ORS;  Service: Urology;  Laterality: Left;  . SPINE SURGERY      Home Medications    Prior to Admission medications   Medication Sig Start Date End Date Taking? Authorizing Provider  ibuprofen (ADVIL,MOTRIN) 600 MG tablet Take 600 mg by mouth every 6 (six) hours as needed. for pain 09/12/17  Yes [provider]  lisinopril (PRINIVIL,ZESTRIL) 5 MG tablet  12/20/17  Yes [provider]  metFORMIN (GLUCOPHAGE) 500 MG tablet Take 500 mg by mouth 2 (two) times daily with a meal. 10/04/17  Yes [provider]  omeprazole (PRILOSEC) 10 MG capsule Take 10 mg by mouth daily.   Yes [provider]  rosuvastatin (CRESTOR) 5 MG tablet Take 5 mg by mouth daily. 09/10/19 06/23/20 Yes [provider]  cyanocobalamin 100 MCG tablet Take by mouth.    [provider]  loratadine (CLARITIN) 10 MG tablet Take 10 mg by mouth daily.  06/23/20  [provider]    Family History Family History  Adopted: Yes  Problem Relation Age of Onset  . Cancer Mother   . Cancer Father     Social History Social History   Tobacco Use  . Smoking status: Never Smoker  . Smokeless tobacco: Current User    Types: Chew  Vaping Use  . Vaping Use:  Never used  Substance Use Topics  . Alcohol use: Yes    Comment: 2 drinks per day  . Drug use: No     Allergies   Patient has no known allergies.   Review of Systems Review of Systems  Constitutional: Positive for appetite change, chills, fatigue and fever. Negative for diaphoresis.  HENT: Negative for congestion, postnasal drip, rhinorrhea and sore throat.   Eyes: Negative for discharge.  Respiratory: Negative for cough.   Gastrointestinal: Positive for abdominal pain, constipation and nausea. Negative for abdominal distention, blood in stool and diarrhea.  Musculoskeletal: Positive for myalgias. Negative for gait problem.  Skin: Negative for rash.   Neurological: Positive for headaches.  Hematological: Negative for adenopathy.   ?Gastrointestinal + Abdominal pain constipation, anorexia. ?Musculoskeletal + Joint pain/arthralgia, muscle ache/myalgia. ?General + Excessive fatigue ?Neuropsychiatric + Headache    Physical Exam Triage Vital Signs ED Triage Vitals  Enc Vitals Group     BP 06/23/20 1130 134/82     Pulse Rate 06/23/20 1130 82     Resp 06/23/20 1130 18     Temp 06/23/20 1130 98.6 F (37 C)     Temp Source 06/23/20 1130 Oral     SpO2 06/23/20 1130 100 %     Weight 06/23/20 1127 200 lb (90.7 kg)     Height 06/23/20 1127 5\' 8"  (1.727 m)     Head Circumference --      Peak Flow --      Pain Score 06/23/20 1126 4     Pain Loc --      Pain Edu? --      Excl. in GC? --    No data found.  Updated Vital Signs BP 134/82 (BP Location: Left Arm)   Pulse 82   Temp 98.6 F (37 C) (Oral)   Resp 18   Ht 5\' 8"  (1.727 m)   Wt 200 lb (90.7 kg)   SpO2 100%   BMI 30.41 kg/m   Visual Acuity Right Eye Distance:   Left Eye Distance:   Bilateral Distance:    Right Eye Near:   Left Eye Near:    Bilateral Near:     Physical Exam Vitals and nursing note reviewed.  Constitutional:      General: He is not in acute distress.    Appearance: He is obese. He is not toxic-appearing.  HENT:     Head: Normocephalic.  Eyes:     General: No scleral icterus.    Extraocular Movements: Extraocular movements intact.  Cardiovascular:     Rate and Rhythm: Normal rate and regular rhythm.     Heart sounds: No murmur heard.   Pulmonary:     Effort: Pulmonary effort is normal.     Breath sounds: Normal breath sounds.  Abdominal:     General: Abdomen is protuberant. Bowel sounds are normal.     Palpations: Abdomen is soft. There is no hepatomegaly or splenomegaly.     Tenderness: There is abdominal tenderness in the right upper quadrant. There is no guarding or rebound.     Comments: Has mild tenderness on RUQ  Skin:     General: Skin is warm and dry.  Neurological:     Mental Status: He is alert and oriented to person, place, and time.  Psychiatric:        Mood and Affect: Mood normal.        Behavior: Behavior normal.      UC Treatments / Results  Labs (  all labs ordered are listed, but only abnormal results are displayed) Labs Reviewed - No data to display  EKG   Radiology No results found.  Procedures Procedures (including critical care time)  Medications Ordered in UC Medications - No data to display  Initial Impression / Assessment and Plan / UC Course  I have reviewed the triage vital signs and the nursing notes. Pt wanted [facility] to ran his test since it is free through his wife's job,so were ordered as outpatient and we will inform him of the results when they are done. In the mean time advised to try Mylanta up to 3 doses today and see if this helps, which I will find out when I call him with results.  I explained to him that lead poisoning per [employer] does not cause a fever, but may have had a GI bug instead.  Final Clinical Impressions(s) / UC Diagnoses   Final diagnoses:  Pain of upper abdomen  Lead exposure     Discharge Instructions     I will call you when the chemistry and cell count is back. In the mean time I want you to try Mylanta 2-3 times a day today. If it is from a virus, you may have inflammation in your stomach lining.     ED Prescriptions    None     PDMP not reviewed this encounter.   Garey Ham, PA-C 06/23/20 1601

## 2020-06-23 NOTE — Discharge Instructions (Signed)
I will call you when the chemistry and cell count is back. In the mean time I want you to try Mylanta 2-3 times a day today. If it is from a virus, you may have inflammation in your stomach lining.

## 2020-06-25 LAB — CBC WITH DIFFERENTIAL
Basophils Absolute: 0 10*3/uL (ref 0.0–0.2)
Basos: 1 %
EOS (ABSOLUTE): 0.2 10*3/uL (ref 0.0–0.4)
Eos: 4 %
Hematocrit: 44.2 % (ref 37.5–51.0)
Hemoglobin: 14.3 g/dL (ref 13.0–17.7)
Immature Grans (Abs): 0 10*3/uL (ref 0.0–0.1)
Immature Granulocytes: 1 %
Lymphocytes Absolute: 1.6 10*3/uL (ref 0.7–3.1)
Lymphs: 32 %
MCH: 21.9 pg — ABNORMAL LOW (ref 26.6–33.0)
MCHC: 32.4 g/dL (ref 31.5–35.7)
MCV: 68 fL — ABNORMAL LOW (ref 79–97)
Monocytes Absolute: 0.5 10*3/uL (ref 0.1–0.9)
Monocytes: 11 %
Neutrophils Absolute: 2.5 10*3/uL (ref 1.4–7.0)
Neutrophils: 51 %
RBC: 6.52 x10E6/uL — ABNORMAL HIGH (ref 4.14–5.80)
RDW: 14.3 % (ref 11.6–15.4)
WBC: 4.8 10*3/uL (ref 3.4–10.8)

## 2020-06-25 LAB — CMP14 + ANION GAP
ALT: 57 IU/L — ABNORMAL HIGH (ref 0–44)
AST: 38 IU/L (ref 0–40)
Albumin/Globulin Ratio: 1.8 (ref 1.2–2.2)
Albumin: 4.4 g/dL (ref 3.8–4.9)
Alkaline Phosphatase: 66 IU/L (ref 44–121)
Anion Gap: 17 mmol/L (ref 10.0–18.0)
BUN/Creatinine Ratio: 11 (ref 9–20)
BUN: 11 mg/dL (ref 6–24)
Bilirubin Total: 1.3 mg/dL — ABNORMAL HIGH (ref 0.0–1.2)
CO2: 21 mmol/L (ref 20–29)
Calcium: 8.8 mg/dL (ref 8.7–10.2)
Chloride: 100 mmol/L (ref 96–106)
Creatinine, Ser: 0.99 mg/dL (ref 0.76–1.27)
GFR calc Af Amer: 99 mL/min/{1.73_m2} (ref >59)
GFR calc non Af Amer: 86 mL/min/{1.73_m2} (ref >59)
Globulin, Total: 2.4 g/dL (ref 1.5–4.5)
Glucose: 112 mg/dL — ABNORMAL HIGH (ref 65–99)
Potassium: 4.4 mmol/L (ref 3.5–5.2)
Sodium: 138 mmol/L (ref 134–144)
Total Protein: 6.8 g/dL (ref 6.0–8.5)

## 2020-06-25 LAB — LEAD STANDARD PROFILE, BLOOD
Lead, Blood (Adult): <1 ug/dL (ref 0–4)
Zinc Protoporphyrin (ZPP): 42 ug/dL (ref 0–99)

## 2022-08-26 ENCOUNTER — Other Ambulatory Visit: Payer: Self-pay

## 2022-08-26 DIAGNOSIS — R1011 Right upper quadrant pain: Secondary | ICD-10-CM

## 2022-08-30 ENCOUNTER — Ambulatory Visit
Admission: RE | Admit: 2022-08-30 | Discharge: 2022-08-30 | Disposition: A | Discharge status: Home / Self Care | Payer: Managed Care, Other (non HMO) | Source: Ambulatory Visit | Attending: Family Medicine | Admitting: Family Medicine

## 2022-08-30 DIAGNOSIS — R1011 Right upper quadrant pain: Secondary | ICD-10-CM | POA: Diagnosis not present

## 2022-08-31 ENCOUNTER — Other Ambulatory Visit: Payer: Managed Care, Other (non HMO)

## 2022-08-31 ENCOUNTER — Encounter: Payer: Managed Care, Other (non HMO) | Admitting: Oncology

## 2022-09-01 ENCOUNTER — Inpatient Hospital Stay: Payer: Managed Care, Other (non HMO)

## 2022-09-01 ENCOUNTER — Encounter: Payer: Self-pay | Admitting: Oncology

## 2022-09-01 ENCOUNTER — Inpatient Hospital Stay: Payer: Managed Care, Other (non HMO) | Attending: Oncology | Admitting: Oncology

## 2022-09-01 VITALS — BP 117/81 | HR 60 | Temp 96.1°F | Resp 18 | Wt 214.6 lb

## 2022-09-01 DIAGNOSIS — D563 Thalassemia minor: Secondary | ICD-10-CM

## 2022-09-01 DIAGNOSIS — Z87891 Personal history of nicotine dependence: Secondary | ICD-10-CM | POA: Diagnosis not present

## 2022-09-01 NOTE — Assessment & Plan Note (Addendum)
Labs are reviewed and discussed with patient. Check alpha thalassemia DNA analysis.  This is likely the cause of elevated RBC and chronic microcytosis.normal hemoglobin. No intervention needed.  Recommend first degree relative to be tested when planning family.

## 2022-09-01 NOTE — Progress Notes (Signed)
Hematology/Oncology Consult note [phone] [phone]        REFERRING PROVIDER: Marina Goodell, MD   CHIEF COMPLAINTS/REASON FOR VISIT:  Evaluation of elevated RBC   ASSESSMENT & PLAN:   Alpha thalassemia trait Labs are reviewed and discussed with patient. Check alpha thalassemia DNA analysis.  This is likely the cause of elevated RBC and chronic microcytosis.normal hemoglobin. No intervention needed.  Recommend first degree relative to be tested when planning family.   No need for routine follow up.   Rickard Patience, MD, PhD [facility] Hematology Oncology 09/01/2022   HISTORY OF PRESENTING ILLNESS:   Pedro Gardner is a  57 y.o.  male with PMH listed below was seen in consultation at the request of  Feldpausch, Madaline Guthrie, MD  for evaluation of elevated RBC He has a chronic history of elevated RBC. Normal hemoglobin.  Report history alpha thalassemia.  Feels well. No complaints.    MEDICAL HISTORY:  Past Medical History:  Diagnosis Date   Allergic rhinitis    Diabetes mellitus without complication    Fatty liver    GERD (gastroesophageal reflux disease)    Hypertension    not currently being treated for this   Liver lesion 2019   Plantar fasciitis    Renal cysts and diabetes syndrome 10/2017   Seasonal allergies    Sleep apnea     SURGICAL HISTORY: Past Surgical History:  Procedure Laterality Date   BACK SURGERY     COLONOSCOPY WITH PROPOFOL N/A 06/11/2016   Procedure: COLONOSCOPY WITH PROPOFOL;  Surgeon: Christena Deem, MD;  Location: [facility] ENDOSCOPY;  Service: Endoscopy;  Laterality: N/A;   ELBOW SURGERY Right    EXTRACORPOREAL SHOCK WAVE LITHOTRIPSY Left 10/13/2017   Procedure: EXTRACORPOREAL SHOCK WAVE LITHOTRIPSY (ESWL);  Surgeon: Vanna Scotland, MD;  Location: [facility] ORS;  Service: Urology;  Laterality: Left;   SPINE SURGERY      SOCIAL HISTORY: Social History   Socioeconomic History   Marital status: Married    Spouse name:  Not on file   Number of children: Not on file   Years of education: Not on file   Highest education level: Not on file  Occupational History   Not on file  Tobacco Use   Smoking status: Former    Types: Cigarettes   Smokeless tobacco: Current    Types: Chew  Vaping Use   Vaping Use: Never used  Substance and Sexual Activity   Alcohol use: Yes    Comment: 2 drinks per day   Drug use: No   Sexual activity: Yes  Other Topics Concern   Not on file  Social History Narrative   Not on file   Social Determinants of Health   Financial Resource Strain: Low Risk  (09/01/2022)   Overall Financial Resource Strain (CARDIA)    Difficulty of Paying Living Expenses: Not very hard  Food Insecurity: No Food Insecurity (09/01/2022)   Hunger Vital Sign    Worried About Running Out of Food in the Last Year: Never true    Ran Out of Food in the Last Year: Never true  Transportation Needs: No Transportation Needs (09/01/2022)   PRAPARE - Administrator, Civil Service (Medical): No    Lack of Transportation (Non-Medical): No  Physical Activity: Not on file  Stress: No Stress Concern Present (09/01/2022)   [employer] of Occupational Health - Occupational Stress Questionnaire    Feeling of Stress : Only a little  Social Connections: Not  on file  Intimate Partner Violence: Not At Risk (09/01/2022)   Humiliation, Afraid, Rape, and Kick questionnaire    Fear of Current or Ex-Partner: No    Emotionally Abused: No    Physically Abused: No    Sexually Abused: No    FAMILY HISTORY: Family History  Adopted: Yes  Problem Relation Age of Onset   Cancer Mother    Cancer Father     ALLERGIES:  has No Known Allergies.  MEDICATIONS:  Current Outpatient Medications  Medication Sig Dispense Refill   atorvastatin (LIPITOR) 10 MG tablet Take 1 tablet by mouth daily.     cetirizine (ZYRTEC) 10 MG tablet Take by mouth.     lisinopril (PRINIVIL,ZESTRIL) 5 MG tablet      omeprazole  (PRILOSEC) 10 MG capsule Take 10 mg by mouth daily.     cyanocobalamin 100 MCG tablet Take by mouth. (Patient not taking: Reported on 09/01/2022)     ibuprofen (ADVIL,MOTRIN) 600 MG tablet Take 600 mg by mouth every 6 (six) hours as needed. for pain (Patient not taking: Reported on 09/01/2022)  0   metFORMIN (GLUCOPHAGE) 500 MG tablet Take 500 mg by mouth 2 (two) times daily with a meal. (Patient not taking: Reported on 09/01/2022)  11   omeprazole (PRILOSEC) 10 MG capsule Take 1 capsule by mouth daily. (Patient not taking: Reported on 09/01/2022)     No current facility-administered medications for this visit.    Review of Systems  Constitutional:  Negative for appetite change, chills, fatigue, fever and unexpected weight change.  HENT:   Negative for hearing loss and voice change.   Eyes:  Negative for eye problems and icterus.  Respiratory:  Negative for chest tightness, cough and shortness of breath.   Cardiovascular:  Negative for chest pain and leg swelling.  Gastrointestinal:  Negative for abdominal distention and abdominal pain.  Endocrine: Negative for hot flashes.  Genitourinary:  Negative for difficulty urinating, dysuria and frequency.   Musculoskeletal:  Negative for arthralgias.  Skin:  Negative for itching and rash.  Neurological:  Negative for light-headedness and numbness.  Hematological:  Negative for adenopathy. Does not bruise/bleed easily.  Psychiatric/Behavioral:  Negative for confusion.    PHYSICAL EXAMINATION: ECOG PERFORMANCE STATUS: 0 - Asymptomatic Vitals:   09/01/22 0932  BP: 117/81  Pulse: 60  Resp: 18  Temp: (!) 96.1 F (35.6 C)  SpO2: 100%   Filed Weights   09/01/22 0932  Weight: 214 lb 9.6 oz (97.3 kg)    Physical Exam Constitutional:      General: He is not in acute distress. HENT:     Head: Normocephalic and atraumatic.  Eyes:     General: No scleral icterus. Cardiovascular:     Rate and Rhythm: Normal rate and regular rhythm.     Heart  sounds: Normal heart sounds.  Pulmonary:     Effort: Pulmonary effort is normal. No respiratory distress.     Breath sounds: No wheezing.  Abdominal:     General: Bowel sounds are normal. There is no distension.     Palpations: Abdomen is soft.  Musculoskeletal:        General: No deformity. Normal range of motion.     Cervical back: Normal range of motion and neck supple.  Skin:    General: Skin is warm and dry.     Findings: No erythema or rash.  Neurological:     Mental Status: He is alert and oriented to person, place, and time. Mental status  is at baseline.     Cranial Nerves: No cranial nerve deficit.     Coordination: Coordination normal.  Psychiatric:        Mood and Affect: Mood normal.     LABORATORY DATA:  I have reviewed the data as listed    Latest Ref Rng & Units 06/23/2020    1:19 PM  CBC  WBC 3.4 - 10.8 x10E3/uL 4.8   Hemoglobin 13.0 - 17.7 g/dL 09.8   Hematocrit 11.9 - 51.0 % 44.2       Latest Ref Rng & Units 06/23/2020    1:19 PM  CMP  Glucose 65 - 99 mg/dL 147   BUN 6 - 24 mg/dL 11   Creatinine 8.29 - 1.27 mg/dL 5.62   Sodium 130 - 865 mmol/L 138   Potassium 3.5 - 5.2 mmol/L 4.4   Chloride 96 - 106 mmol/L 100   CO2 20 - 29 mmol/L 21   Calcium 8.7 - 10.2 mg/dL 8.8   Total Protein 6.0 - 8.5 g/dL 6.8   Total Bilirubin 0.0 - 1.2 mg/dL 1.3   Alkaline Phos 44 - 121 IU/L 66   AST 0 - 40 IU/L 38   ALT 0 - 44 IU/L 57       RADIOGRAPHIC STUDIES: I have personally reviewed the radiological images as listed and agreed with the findings in the report. US Abdomen Limited RUQ (LIVER/GB)  Result Date: 08/30/2022 CLINICAL DATA:  Right upper quadrant pain EXAM: ULTRASOUND ABDOMEN LIMITED RIGHT UPPER QUADRANT COMPARISON:  MRI abdomen 10/29/2019 FINDINGS: Gallbladder: There is a 6 mm echogenic round mass within the gallbladder. No gallbladder wall thickening or pericholecystic fluid. Negative sonographic Murphy's sign. Common bile duct: Diameter: 5.7 mm Liver:  Increased echogenicity. Fatty sparing adjacent to the gallbladder fossa. portal vein is patent on color Doppler imaging with normal direction of blood flow towards the liver. Other: Incidental note is made of a small right renal cyst. No imaging follow-up needed. IMPRESSION: 1. There is a 6 mm echogenic round mass within the gallbladder which may represent a small polyp or stone. Recommend follow-up right upper quadrant ultrasound in 12 months to assess for stability. 2. Increased hepatic parenchymal echogenicity suggestive of steatosis. Electronically Signed   By: Annia Belt M.D.   On: 08/30/2022 11:08

## 2022-09-21 ENCOUNTER — Encounter: Payer: Self-pay | Admitting: Oncology

## 2022-09-29 ENCOUNTER — Telehealth: Payer: Self-pay

## 2022-09-29 ENCOUNTER — Other Ambulatory Visit: Payer: Self-pay

## 2022-09-29 DIAGNOSIS — D563 Thalassemia minor: Secondary | ICD-10-CM

## 2022-09-29 NOTE — Telephone Encounter (Signed)
-----   Message from Rickard Patience, MD sent at 09/28/2022  7:50 PM EDT ----- Please arrange him to get hemoglobinopathy evaluation, iron tibc ferritin, retic panel, cbc. And arrange him to see me 2 weeks after the labs thanks.

## 2022-09-29 NOTE — Telephone Encounter (Signed)
Scheduling will you schedule patient for lab only in the next week or so and then schedule patient to see Dr. Cathie Hoops 2 weeks after lab visit and notify patient of dates and times. Thank you!!

## 2022-10-05 ENCOUNTER — Telehealth: Payer: Self-pay | Admitting: *Deleted

## 2022-10-05 NOTE — Telephone Encounter (Signed)
CAll returned to Ms Niklas and informed that order was faxed

## 2022-10-05 NOTE — Telephone Encounter (Signed)
Patient wife called reporting that patient needs to get his labs drawn at [facility] and would like for [place] to fax an order to [facility] in [place] and let her know when done please or if she will need to come pick up an order form from [place]. He is down for lab physician tomorrow

## 2022-10-06 ENCOUNTER — Inpatient Hospital Stay: Payer: Managed Care, Other (non HMO) | Attending: Oncology

## 2022-10-19 ENCOUNTER — Encounter: Payer: Self-pay | Admitting: Oncology

## 2022-10-20 ENCOUNTER — Encounter: Payer: Self-pay | Admitting: Oncology

## 2022-10-20 ENCOUNTER — Inpatient Hospital Stay: Payer: Managed Care, Other (non HMO) | Attending: Oncology | Admitting: Oncology

## 2022-10-20 VITALS — BP 112/75 | HR 87 | Temp 96.9°F | Resp 18 | Wt 206.4 lb

## 2022-10-20 DIAGNOSIS — Z87891 Personal history of nicotine dependence: Secondary | ICD-10-CM | POA: Diagnosis not present

## 2022-10-20 DIAGNOSIS — D563 Thalassemia minor: Secondary | ICD-10-CM | POA: Insufficient documentation

## 2022-10-20 NOTE — Assessment & Plan Note (Signed)
Labs are reviewed and discussed with patient. Patient has beta thalassemia minor. This is likely the cause of elevated RBC and chronic microcytosis.normal hemoglobin. No intervention needed.  Recommend first degree relative to be tested when planning family.

## 2022-10-20 NOTE — Progress Notes (Signed)
Hematology/Oncology Consult note [phone] [phone]        REFERRING PROVIDER: Marina Goodell, MD   CHIEF COMPLAINTS/REASON FOR VISIT:  Evaluation of elevated RBC   ASSESSMENT & PLAN:   Beta thalassemia minor Labs are reviewed and discussed with patient. Patient has beta thalassemia minor. This is likely the cause of elevated RBC and chronic microcytosis.normal hemoglobin. No intervention needed.  Recommend first degree relative to be tested when planning family.   No need for routine follow up.   Rickard Patience, MD, PhD [facility] Hematology Oncology 10/20/2022   HISTORY OF PRESENTING ILLNESS:   Pedro Gardner is a  57 y.o.  male with PMH listed below was seen in consultation at the request of  Feldpausch, Madaline Guthrie, MD  for evaluation of elevated RBC Patient has blood work done at [facility] and present to discuss results. He was found to have beta thalassemia minor.   MEDICAL HISTORY:  Past Medical History:  Diagnosis Date   Allergic rhinitis    Diabetes mellitus without complication (HCC)    Fatty liver    GERD (gastroesophageal reflux disease)    Hypertension    not currently being treated for this   Liver lesion 2019   Plantar fasciitis    Renal cysts and diabetes syndrome (HCC) 10/2017   Seasonal allergies    Sleep apnea     SURGICAL HISTORY: Past Surgical History:  Procedure Laterality Date   BACK SURGERY     COLONOSCOPY WITH PROPOFOL N/A 06/11/2016   Procedure: COLONOSCOPY WITH PROPOFOL;  Surgeon: Christena Deem, MD;  Location: [facility] ENDOSCOPY;  Service: Endoscopy;  Laterality: N/A;   ELBOW SURGERY Right    EXTRACORPOREAL SHOCK WAVE LITHOTRIPSY Left 10/13/2017   Procedure: EXTRACORPOREAL SHOCK WAVE LITHOTRIPSY (ESWL);  Surgeon: Vanna Scotland, MD;  Location: [facility] ORS;  Service: Urology;  Laterality: Left;   SPINE SURGERY      SOCIAL HISTORY: Social History   Socioeconomic History   Marital status: Married    Spouse name:  Not on file   Number of children: Not on file   Years of education: Not on file   Highest education level: Not on file  Occupational History   Not on file  Tobacco Use   Smoking status: Former    Types: Cigarettes   Smokeless tobacco: Current    Types: Chew  Vaping Use   Vaping Use: Never used  Substance and Sexual Activity   Alcohol use: Yes    Comment: 2 drinks per day   Drug use: No   Sexual activity: Yes  Other Topics Concern   Not on file  Social History Narrative   Not on file   Social Determinants of Health   Financial Resource Strain: Low Risk  (09/01/2022)   Overall Financial Resource Strain (CARDIA)    Difficulty of Paying Living Expenses: Not very hard  Food Insecurity: No Food Insecurity (09/01/2022)   Hunger Vital Sign    Worried About Running Out of Food in the Last Year: Never true    Ran Out of Food in the Last Year: Never true  Transportation Needs: No Transportation Needs (09/01/2022)   PRAPARE - Administrator, Civil Service (Medical): No    Lack of Transportation (Non-Medical): No  Physical Activity: Not on file  Stress: No Stress Concern Present (09/01/2022)   [employer] of Occupational Health - Occupational Stress Questionnaire    Feeling of Stress : Only a little  Social Connections: Not  on file  Intimate Partner Violence: Not At Risk (09/01/2022)   Humiliation, Afraid, Rape, and Kick questionnaire    Fear of Current or Ex-Partner: No    Emotionally Abused: No    Physically Abused: No    Sexually Abused: No    FAMILY HISTORY: Family History  Adopted: Yes  Problem Relation Age of Onset   Cancer Mother    Cancer Father     ALLERGIES:  has No Known Allergies.  MEDICATIONS:  Current Outpatient Medications  Medication Sig Dispense Refill   atorvastatin (LIPITOR) 10 MG tablet Take 1 tablet by mouth daily.     cetirizine (ZYRTEC) 10 MG tablet Take by mouth.     empagliflozin (JARDIANCE) 25 MG TABS tablet Take 1 tablet by  mouth daily.     lisinopril (PRINIVIL,ZESTRIL) 5 MG tablet      omeprazole (PRILOSEC) 10 MG capsule Take 10 mg by mouth daily.     cyanocobalamin 100 MCG tablet Take by mouth. (Patient not taking: Reported on 09/01/2022)     ibuprofen (ADVIL,MOTRIN) 600 MG tablet Take 600 mg by mouth every 6 (six) hours as needed. for pain (Patient not taking: Reported on 09/01/2022)  0   metFORMIN (GLUCOPHAGE) 500 MG tablet Take 500 mg by mouth 2 (two) times daily with a meal. (Patient not taking: Reported on 09/01/2022)  11   No current facility-administered medications for this visit.    Review of Systems  Constitutional:  Negative for appetite change, chills, fatigue, fever and unexpected weight change.  HENT:   Negative for hearing loss and voice change.   Eyes:  Negative for eye problems and icterus.  Respiratory:  Negative for chest tightness, cough and shortness of breath.   Cardiovascular:  Negative for chest pain and leg swelling.  Gastrointestinal:  Negative for abdominal distention and abdominal pain.  Endocrine: Negative for hot flashes.  Genitourinary:  Negative for difficulty urinating, dysuria and frequency.   Musculoskeletal:  Negative for arthralgias.  Skin:  Negative for itching and rash.  Neurological:  Negative for light-headedness and numbness.  Hematological:  Negative for adenopathy. Does not bruise/bleed easily.  Psychiatric/Behavioral:  Negative for confusion.    PHYSICAL EXAMINATION: ECOG PERFORMANCE STATUS: 0 - Asymptomatic Vitals:   10/20/22 1316  BP: 112/75  Pulse: 87  Resp: 18  Temp: (!) 96.9 F (36.1 C)  SpO2: 100%   Filed Weights   10/20/22 1316  Weight: 206 lb 6.4 oz (93.6 kg)    Physical Exam Constitutional:      General: He is not in acute distress. HENT:     Head: Normocephalic and atraumatic.  Eyes:     General: No scleral icterus. Cardiovascular:     Rate and Rhythm: Normal rate.  Pulmonary:     Effort: Pulmonary effort is normal. No respiratory  distress.  Abdominal:     General: There is no distension.  Musculoskeletal:        General: No deformity. Normal range of motion.     Cervical back: Normal range of motion and neck supple.  Skin:    Findings: No erythema or rash.  Neurological:     Mental Status: He is alert and oriented to person, place, and time. Mental status is at baseline.     Cranial Nerves: No cranial nerve deficit.     Coordination: Coordination normal.  Psychiatric:        Mood and Affect: Mood normal.     LABORATORY DATA:  I have reviewed the data as  listed    Latest Ref Rng & Units 06/23/2020    1:19 PM  CBC  WBC 3.4 - 10.8 x10E3/uL 4.8   Hemoglobin 13.0 - 17.7 g/dL 16.1   Hematocrit 09.6 - 51.0 % 44.2       Latest Ref Rng & Units 06/23/2020    1:19 PM  CMP  Glucose 65 - 99 mg/dL 045   BUN 6 - 24 mg/dL 11   Creatinine 4.09 - 1.27 mg/dL 8.11   Sodium 914 - 782 mmol/L 138   Potassium 3.5 - 5.2 mmol/L 4.4   Chloride 96 - 106 mmol/L 100   CO2 20 - 29 mmol/L 21   Calcium 8.7 - 10.2 mg/dL 8.8   Total Protein 6.0 - 8.5 g/dL 6.8   Total Bilirubin 0.0 - 1.2 mg/dL 1.3   Alkaline Phos 44 - 121 IU/L 66   AST 0 - 40 IU/L 38   ALT 0 - 44 IU/L 57       RADIOGRAPHIC STUDIES: I have personally reviewed the radiological images as listed and agreed with the findings in the report. US Abdomen Limited RUQ (LIVER/GB)  Result Date: 08/30/2022 CLINICAL DATA:  Right upper quadrant pain EXAM: ULTRASOUND ABDOMEN LIMITED RIGHT UPPER QUADRANT COMPARISON:  MRI abdomen 10/29/2019 FINDINGS: Gallbladder: There is a 6 mm echogenic round mass within the gallbladder. No gallbladder wall thickening or pericholecystic fluid. Negative sonographic Murphy's sign. Common bile duct: Diameter: 5.7 mm Liver: Increased echogenicity. Fatty sparing adjacent to the gallbladder fossa. portal vein is patent on color Doppler imaging with normal direction of blood flow towards the liver. Other: Incidental note is made of a small right renal  cyst. No imaging follow-up needed. IMPRESSION: 1. There is a 6 mm echogenic round mass within the gallbladder which may represent a small polyp or stone. Recommend follow-up right upper quadrant ultrasound in 12 months to assess for stability. 2. Increased hepatic parenchymal echogenicity suggestive of steatosis. Electronically Signed   By: Annia Belt M.D.   On: 08/30/2022 11:08

## 2023-10-12 ENCOUNTER — Other Ambulatory Visit: Payer: Self-pay | Admitting: Nephrology

## 2023-10-12 DIAGNOSIS — R809 Proteinuria, unspecified: Secondary | ICD-10-CM

## 2023-10-12 DIAGNOSIS — N182 Chronic kidney disease, stage 2 (mild): Secondary | ICD-10-CM

## 2023-10-12 DIAGNOSIS — E1122 Type 2 diabetes mellitus with diabetic chronic kidney disease: Secondary | ICD-10-CM

## 2023-10-14 ENCOUNTER — Ambulatory Visit
Admission: RE | Admit: 2023-10-14 | Discharge: 2023-10-14 | Disposition: A | Discharge status: Home / Self Care | Source: Ambulatory Visit | Attending: Nephrology | Admitting: Nephrology

## 2023-10-14 DIAGNOSIS — N182 Chronic kidney disease, stage 2 (mild): Secondary | ICD-10-CM | POA: Diagnosis present

## 2023-10-14 DIAGNOSIS — E1122 Type 2 diabetes mellitus with diabetic chronic kidney disease: Secondary | ICD-10-CM | POA: Insufficient documentation

## 2023-10-14 DIAGNOSIS — R809 Proteinuria, unspecified: Secondary | ICD-10-CM | POA: Diagnosis present

## 2023-10-25 ENCOUNTER — Ambulatory Visit: Payer: Self-pay | Admitting: Internal Medicine
